# Patient Record
Sex: Female | Born: 1990 | Race: Black or African American | Hispanic: No | Marital: Single | Smoking: Current every day smoker
Health system: Southern US, Community
[De-identification: ages and names within clinical notes are randomized; demographics above are authoritative.]

## PROBLEM LIST (undated history)

## (undated) ENCOUNTER — Inpatient Hospital Stay (HOSPITAL_COMMUNITY): Payer: Self-pay

## (undated) DIAGNOSIS — Z789 Other specified health status: Secondary | ICD-10-CM

## (undated) DIAGNOSIS — Z8619 Personal history of other infectious and parasitic diseases: Secondary | ICD-10-CM

## (undated) HISTORY — DX: Personal history of other infectious and parasitic diseases: Z86.19

## (undated) HISTORY — PX: NO PAST SURGERIES: SHX2092

---
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
## 2009-04-08 ENCOUNTER — Emergency Department (HOSPITAL_COMMUNITY): Admission: EM | Admit: 2009-04-08 | Discharge: 2009-04-08 | Payer: Self-pay | Admitting: Family Medicine

## 2009-06-26 ENCOUNTER — Emergency Department (HOSPITAL_COMMUNITY): Admission: EM | Admit: 2009-06-26 | Discharge: 2009-06-26 | Payer: Self-pay | Admitting: Emergency Medicine

## 2009-07-04 ENCOUNTER — Emergency Department (HOSPITAL_COMMUNITY): Admission: EM | Admit: 2009-07-04 | Discharge: 2009-07-04 | Payer: Self-pay | Admitting: Family Medicine

## 2010-02-26 ENCOUNTER — Emergency Department (HOSPITAL_COMMUNITY): Admission: EM | Admit: 2010-02-26 | Discharge: 2010-02-26 | Payer: Self-pay | Admitting: Family Medicine

## 2010-05-14 ENCOUNTER — Emergency Department (HOSPITAL_COMMUNITY): Admission: EM | Admit: 2010-05-14 | Discharge: 2010-05-14 | Payer: Self-pay | Admitting: Emergency Medicine

## 2010-09-05 ENCOUNTER — Emergency Department (HOSPITAL_COMMUNITY): Admission: EM | Admit: 2010-09-05 | Discharge: 2010-09-05 | Payer: Self-pay | Admitting: Emergency Medicine

## 2011-01-27 LAB — POCT URINALYSIS DIPSTICK
Bilirubin Urine: NEGATIVE
Glucose, UA: NEGATIVE mg/dL
Ketones, ur: NEGATIVE mg/dL
Nitrite: NEGATIVE
Protein, ur: NEGATIVE mg/dL
Specific Gravity, Urine: >=1.03 (ref 1.005–1.030)
Urobilinogen, UA: 0.2 mg/dL (ref 0.0–1.0)
pH: 5 (ref 5.0–8.0)

## 2011-01-27 LAB — GC/CHLAMYDIA PROBE AMP, GENITAL
Chlamydia, DNA Probe: NEGATIVE
GC Probe Amp, Genital: NEGATIVE

## 2011-01-27 LAB — WET PREP, GENITAL: Yeast Wet Prep HPF POC: NONE SEEN

## 2011-01-31 LAB — POCT URINALYSIS DIP (DEVICE)
Hgb urine dipstick: NEGATIVE
Protein, ur: NEGATIVE mg/dL
Specific Gravity, Urine: 1.025 (ref 1.005–1.030)
Urobilinogen, UA: 2 mg/dL — ABNORMAL HIGH (ref 0.0–1.0)
pH: 6 (ref 5.0–8.0)

## 2011-01-31 LAB — GC/CHLAMYDIA PROBE AMP, GENITAL
Chlamydia, DNA Probe: NEGATIVE
GC Probe Amp, Genital: NEGATIVE

## 2011-01-31 LAB — POCT PREGNANCY, URINE: Preg Test, Ur: NEGATIVE

## 2011-01-31 LAB — WET PREP, GENITAL: WBC, Wet Prep HPF POC: NONE SEEN

## 2011-02-15 ENCOUNTER — Inpatient Hospital Stay (INDEPENDENT_AMBULATORY_CARE_PROVIDER_SITE_OTHER)
Admission: RE | Admit: 2011-02-15 | Discharge: 2011-02-15 | Disposition: A | Discharge status: Home / Self Care | Payer: Self-pay | Source: Ambulatory Visit | Attending: Family Medicine | Admitting: Family Medicine

## 2011-02-15 DIAGNOSIS — H6120 Impacted cerumen, unspecified ear: Secondary | ICD-10-CM

## 2011-02-20 LAB — POCT URINALYSIS DIP (DEVICE)
Hgb urine dipstick: NEGATIVE
Nitrite: NEGATIVE
Specific Gravity, Urine: 1.02 (ref 1.005–1.030)
Urobilinogen, UA: 2 mg/dL — ABNORMAL HIGH (ref 0.0–1.0)
pH: 6 (ref 5.0–8.0)

## 2011-02-20 LAB — WET PREP, GENITAL
Clue Cells Wet Prep HPF POC: NONE SEEN
Trich, Wet Prep: NONE SEEN
Yeast Wet Prep HPF POC: NONE SEEN

## 2011-02-20 LAB — POCT PREGNANCY, URINE: Preg Test, Ur: NEGATIVE

## 2011-04-02 NOTE — Op Note (Signed)
NAME:  Diana Clark, Diana Clark                            ACCOUNT NO.:  [id number]   MEDICAL RECORD NO.:  [id number]                   PATIENT TYPE:  INP   LOCATION:  6125                                 FACILITY:  [facility]   PHYSICIAN:  Leonia Corona, M.D.               DATE OF BIRTH:  04-Apr-1991   DATE OF PROCEDURE:  02/23/2004  DATE OF DISCHARGE:                                 OPERATIVE REPORT   PREOPERATIVE DIAGNOSIS:  Right gluteal abscess.   POSTOPERATIVE DIAGNOSIS:  Right gluteal abscess.   OPERATION PERFORMED:  Incision and drainage.   SURGEON:  Leonia Corona, M.D.   ASSISTANT:  Nurse.   ANESTHESIA:  General endotracheal.   INDICATIONS FOR PROCEDURE:  This 20 year old female patient presented to the  emergency room with painful tender swelling over the right buttocks with  discharge of purulent fluid from a central spot on the swelling clinically  consistent with a diagnosis of a large __________ abscess on the right  buttock.  Hence the indication for the procedure.   DESCRIPTION OF PROCEDURE:  The patient was brought to the operating room and  placed supine on the operating table.  General endotracheal tube anesthesia  was given.  The right buttock was cleaned, prepped and draped in the usual  sterile manner.  A small opening at the center of the swelling was pierced  with a blunt tip hemostat and a gush of pus came out which was obtained for  aerobic and anaerobic cultures.  The opening into the abscess cavity was  enlarged with a knife in cruciate fashion.  The edges of the skin corners  were cut with scissors.  The abscess cavity was probed with a right index  finger.  It was a large deep gluteal abscess cavity going up to a depth for  about 4 cm and extending on either side for about 4 to 5 cm.  A fair amount  of thick purulent pus was suctioned out completely.  The abscess cavity was  carefully probed with finger breaking all the septa and cleaning out the  abscess wall.   The wound was then irrigated with dilute hydrogen peroxide  thoroughly until the returning fluid was apparently clear.  No active  bleeders were noted.  The cavity was then packed with iodoform gauze.  It  was covered with Neosporin and 4 x 4 gauze.  The patient tolerated the  procedure well which was smooth and uneventful.  The patient was later  extubated and transported to recovery room in good stable condition.                                               Leonia Corona, M.D.    SF/MEDQ  D:  02/23/2004  T:  02/24/2004  Job:  [id number]

## 2011-04-25 ENCOUNTER — Inpatient Hospital Stay (INDEPENDENT_AMBULATORY_CARE_PROVIDER_SITE_OTHER)
Admission: RE | Admit: 2011-04-25 | Discharge: 2011-04-25 | Disposition: A | Discharge status: Home / Self Care | Payer: Self-pay | Source: Ambulatory Visit | Attending: Emergency Medicine | Admitting: Emergency Medicine

## 2011-04-25 DIAGNOSIS — N949 Unspecified condition associated with female genital organs and menstrual cycle: Secondary | ICD-10-CM

## 2011-04-25 LAB — POCT URINALYSIS DIP (DEVICE)
Leukocytes, UA: NEGATIVE
Nitrite: NEGATIVE
Protein, ur: NEGATIVE mg/dL
pH: 5.5 (ref 5.0–8.0)

## 2011-04-25 LAB — WET PREP, GENITAL: Trich, Wet Prep: NONE SEEN

## 2011-04-25 LAB — POCT PREGNANCY, URINE: Preg Test, Ur: NEGATIVE

## 2011-04-27 LAB — GC/CHLAMYDIA PROBE AMP, GENITAL: Chlamydia, DNA Probe: NEGATIVE

## 2011-08-09 LAB — POCT RAPID STREP A: Streptococcus, Group A Screen (Direct): NEGATIVE

## 2012-07-01 ENCOUNTER — Encounter (HOSPITAL_COMMUNITY): Payer: Self-pay

## 2012-07-01 ENCOUNTER — Emergency Department (INDEPENDENT_AMBULATORY_CARE_PROVIDER_SITE_OTHER)
Admission: EM | Admit: 2012-07-01 | Discharge: 2012-07-01 | Disposition: A | Discharge status: Home / Self Care | Payer: Self-pay | Source: Home / Self Care | Attending: Emergency Medicine | Admitting: Emergency Medicine

## 2012-07-01 DIAGNOSIS — R350 Frequency of micturition: Secondary | ICD-10-CM

## 2012-07-01 DIAGNOSIS — R1903 Right lower quadrant abdominal swelling, mass and lump: Secondary | ICD-10-CM

## 2012-07-01 LAB — POCT URINALYSIS DIP (DEVICE)
Ketones, ur: NEGATIVE mg/dL
Leukocytes, UA: NEGATIVE
Protein, ur: 30 mg/dL — AB
Specific Gravity, Urine: 1.025 (ref 1.005–1.030)

## 2012-07-01 LAB — POCT PREGNANCY, URINE: Preg Test, Ur: NEGATIVE

## 2012-07-01 NOTE — ED Notes (Signed)
Pt has rt groin swelling that started two days ago.  She has some urninary frequency also.

## 2013-11-15 NOTE — L&D Delivery Note (Signed)
Delivery Note At 6:40 AM a viable and healthy female was delivered via Vaginal, Spontaneous Delivery (Presentation: ; Occiput Anterior).  APGAR: 9,9 ; weight P.   Placenta status: Intact, Spontaneous.  Cord: 3 vessels with the following complications: None.   Anesthesia: Epidural  Episiotomy: none Lacerations: periurethral Suture Repair: 3.0 vicryl rapide Est. Blood Loss (mL): 400cc  Mom to postpartum.  Baby to Couplet care / Skin to Skin.  Bovard-Stuckert, Taylie Helder 08/28/2014, 6:50 AM  Br/Bo/RI/B+/Contra?

## 2014-01-05 ENCOUNTER — Encounter (HOSPITAL_COMMUNITY): Payer: Self-pay | Admitting: *Deleted

## 2014-01-05 ENCOUNTER — Inpatient Hospital Stay (HOSPITAL_COMMUNITY)
Admission: AD | Admit: 2014-01-05 | Discharge: 2014-01-05 | Disposition: A | Discharge status: Home / Self Care | Payer: Medicaid Other | Source: Ambulatory Visit | Attending: Obstetrics & Gynecology | Admitting: Obstetrics & Gynecology

## 2014-01-05 DIAGNOSIS — O219 Vomiting of pregnancy, unspecified: Secondary | ICD-10-CM

## 2014-01-05 DIAGNOSIS — O99330 Smoking (tobacco) complicating pregnancy, unspecified trimester: Secondary | ICD-10-CM | POA: Insufficient documentation

## 2014-01-05 DIAGNOSIS — O210 Mild hyperemesis gravidarum: Secondary | ICD-10-CM | POA: Insufficient documentation

## 2014-01-05 LAB — URINALYSIS, ROUTINE W REFLEX MICROSCOPIC
GLUCOSE, UA: NEGATIVE mg/dL
HGB URINE DIPSTICK: NEGATIVE
KETONES UR: NEGATIVE mg/dL
Leukocytes, UA: NEGATIVE
Nitrite: NEGATIVE
PROTEIN: 100 mg/dL — AB
Specific Gravity, Urine: 1.015 (ref 1.005–1.030)
Urobilinogen, UA: 4 mg/dL — ABNORMAL HIGH (ref 0.0–1.0)
pH: 8.5 — ABNORMAL HIGH (ref 5.0–8.0)

## 2014-01-05 LAB — POCT PREGNANCY, URINE: Preg Test, Ur: POSITIVE — AB

## 2014-01-05 LAB — URINE MICROSCOPIC-ADD ON

## 2014-01-05 MED ORDER — ONDANSETRON HCL 4 MG PO TABS: 4.0000 mg | ORAL_TABLET | Freq: Every day | ORAL | Status: DC | PRN

## 2014-01-05 MED ORDER — PROMETHAZINE HCL 25 MG PO TABS: 12.5000 mg | ORAL_TABLET | Freq: Four times a day (QID) | ORAL | Status: DC | PRN

## 2014-01-05 MED ORDER — ONDANSETRON 8 MG PO TBDP
8.0000 mg | ORAL_TABLET | ORAL | Status: AC
  Administered 2014-01-05: 8 mg via ORAL
  Filled 2014-01-05: qty 1

## 2014-01-05 NOTE — MAU Note (Signed)
Vomiting all day, approx. 4 times. Hasn't been able to eat anything. LMP 11/20/2013

## 2014-01-05 NOTE — MAU Provider Note (Signed)
Chief Complaint: Emesis   First Provider Initiated Contact with Patient 01/05/14 0104     SUBJECTIVE HPI: Diana Clark is a 23 y.o. G1P0 at [redacted]w[redacted]d by LMP who presents to maternity admissions reporting nausea x2 days with vomiting starting today and occuring x4.  Patient's last menstrual period was 11/20/2013.  She has irregular menses and reports she had not thought she was pregnant, only that her period was late.  She denies abdominal pain, vaginal bleeding, vaginal itching/burning, urinary symptoms, h/a, dizziness, or fever/chills.     History reviewed. No pertinent past medical history. Past Surgical History  Procedure Laterality Date  . Incise and drain abcess  2005    located on the right buttocks   History   Social History  . Marital Status: Single    Spouse Name: N/A    Number of Children: N/A  . Years of Education: N/A   Occupational History  . Not on file.   Social History Main Topics  . Smoking status: Current Every Day Smoker  . Smokeless tobacco: Not on file  . Alcohol Use: Yes  . Drug Use: No  . Sexual Activity: Yes    Birth Control/ Protection: None   Other Topics Concern  . Not on file   Social History Narrative  . No narrative on file   No current facility-administered medications on file prior to encounter.   No current outpatient prescriptions on file prior to encounter.   No Known Allergies  ROS: Pertinent items in HPI  OBJECTIVE Blood pressure 124/76, pulse 103, temperature 97.9 F (36.6 C), temperature source Oral, resp. rate 16, height 5\' 4"  (1.626 m), weight 75.751 kg (167 lb), last menstrual period 11/20/2013, SpO2 100.00%. GENERAL: Well-developed, well-nourished female in no acute distress.  HEENT: Normocephalic HEART: normal rate RESP: normal effort ABDOMEN: Soft, non-tender EXTREMITIES: Nontender, no edema NEURO: Alert and oriented SPECULUM EXAM: Deferred  LAB RESULTS Results for orders placed during the hospital encounter of  01/05/14 (from the past 24 hour(s))  URINALYSIS, ROUTINE W REFLEX MICROSCOPIC     Status: Abnormal   Collection Time    01/05/14 12:40 AM      Result Value Ref Range   Color, Urine YELLOW  YELLOW   APPearance CLEAR  CLEAR   Specific Gravity, Urine 1.015  1.005 - 1.030   pH 8.5 (*) 5.0 - 8.0   Glucose, UA NEGATIVE  NEGATIVE mg/dL   Hgb urine dipstick NEGATIVE  NEGATIVE   Bilirubin Urine SMALL (*) NEGATIVE   Ketones, ur NEGATIVE  NEGATIVE mg/dL   Protein, ur 100 (*) NEGATIVE mg/dL   Urobilinogen, UA 4.0 (*) 0.0 - 1.0 mg/dL   Nitrite NEGATIVE  NEGATIVE   Leukocytes, UA NEGATIVE  NEGATIVE  URINE MICROSCOPIC-ADD ON     Status: Abnormal   Collection Time    01/05/14 12:40 AM      Result Value Ref Range   Squamous Epithelial / LPF FEW (*) RARE   WBC, UA 0-2  <3 WBC/hpf   RBC / HPF 0-2  <3 RBC/hpf   Bacteria, UA FEW (*) RARE  POCT PREGNANCY, URINE     Status: Abnormal   Collection Time    01/05/14 12:47 AM      Result Value Ref Range   Preg Test, Ur POSITIVE (*) NEGATIVE    ASSESSMENT 1. Nausea/vomiting in pregnancy     PLAN Zofran 8 mg ODT x1 dose in MAU.  Pt able to tolerate PO fluids. Discharge home Warning signs of  first trimester pregnancy given Pregnancy verification letter given Pt to start prenatal care as soon as possible Return to MAU as needed    Medication List         acetaminophen 325 MG tablet  Commonly known as:  TYLENOL  Take 650 mg by mouth every 6 (six) hours as needed.     ondansetron 4 MG tablet  Commonly known as:  ZOFRAN  Take 1 tablet (4 mg total) by mouth daily as needed for nausea or vomiting.     promethazine 25 MG tablet  Commonly known as:  PHENERGAN  Take 0.5-1 tablets (12.5-25 mg total) by mouth every 6 (six) hours as needed for nausea or vomiting.       Follow-up Information   Follow up with [facility] [place]. (Or prenatal provider of your choice.  Return to MAU as needed.)    Contact information:   [address] [place] [address] [phone]      Adeliz Tonkinson Leftwich-Kirby Certified Nurse-Midwife 01/05/2014  1:45 AM

## 2014-01-05 NOTE — Discharge Instructions (Signed)
Nausea/Vomiting of Pregnancy Morning sickness is when you feel sick to your stomach (nauseous) during pregnancy. This nauseous feeling may or may not come with vomiting. It often occurs in the morning but can be a problem any time of day. Morning sickness is most common during the first trimester, but it may continue throughout pregnancy. While morning sickness is unpleasant, it is usually harmless unless you develop severe and continual vomiting (hyperemesis gravidarum). This condition requires more intense treatment.  CAUSES  The cause of morning sickness is not completely known but seems to be related to normal hormonal changes that occur in pregnancy. RISK FACTORS You are at greater risk if you:  Experienced nausea or vomiting before your pregnancy.  Had morning sickness during a previous pregnancy.  Are pregnant with more than one baby, such as twins. TREATMENT  Do not use any medicines (prescription, over-the-counter, or herbal) for morning sickness without first talking to your health care provider. Your health care provider may prescribe or recommend:  Vitamin B6 supplements.  Anti-nausea medicines.  The herbal medicine ginger. HOME CARE INSTRUCTIONS   Only take over-the-counter or prescription medicines as directed by your health care provider.  Taking multivitamins before getting pregnant can prevent or decrease the severity of morning sickness in most women.   Eat a piece of dry toast or unsalted crackers before getting out of bed in the morning.   Eat five or six small meals a day.   Eat dry and bland foods (rice, baked potato). Foods high in carbohydrates are often helpful.  Do not drink liquids with your meals. Drink liquids between meals.   Avoid greasy, fatty, and spicy foods.   Get someone to cook for you if the smell of any food causes nausea and vomiting.   If you feel nauseous after taking prenatal vitamins, take the vitamins at night or with a  snack.  Snack on protein foods (nuts, yogurt, cheese) between meals if you are hungry.   Eat unsweetened gelatins for desserts.   Wearing an acupressure wristband (worn for sea sickness) may be helpful.   Acupuncture may be helpful.   Do not smoke.   Get a humidifier to keep the air in your house free of odors.   Get plenty of fresh air. SEEK MEDICAL CARE IF:   Your home remedies are not working, and you need medicine.  You feel dizzy or lightheaded.  You are losing weight. SEEK IMMEDIATE MEDICAL CARE IF:   You have persistent and uncontrolled nausea and vomiting.  You pass out (faint). Document Released: 12/23/2006 Document Revised: 07/04/2013 Document Reviewed: 04/18/2013 [facility] Patient Information 2014 [place].

## 2014-01-24 ENCOUNTER — Other Ambulatory Visit (HOSPITAL_COMMUNITY): Payer: Self-pay | Admitting: Nurse Practitioner

## 2014-01-24 DIAGNOSIS — Z3682 Encounter for antenatal screening for nuchal translucency: Secondary | ICD-10-CM

## 2014-01-24 LAB — OB RESULTS CONSOLE RUBELLA ANTIBODY, IGM: Rubella: IMMUNE

## 2014-02-08 ENCOUNTER — Encounter (HOSPITAL_COMMUNITY): Payer: Self-pay | Admitting: *Deleted

## 2014-02-08 ENCOUNTER — Inpatient Hospital Stay (HOSPITAL_COMMUNITY)
Admission: AD | Admit: 2014-02-08 | Discharge: 2014-02-09 | Disposition: A | Discharge status: Home / Self Care | Payer: Medicaid Other | Source: Ambulatory Visit | Attending: Obstetrics & Gynecology | Admitting: Obstetrics & Gynecology

## 2014-02-08 DIAGNOSIS — M549 Dorsalgia, unspecified: Secondary | ICD-10-CM | POA: Insufficient documentation

## 2014-02-08 DIAGNOSIS — O9989 Other specified diseases and conditions complicating pregnancy, childbirth and the puerperium: Principal | ICD-10-CM

## 2014-02-08 DIAGNOSIS — K5900 Constipation, unspecified: Secondary | ICD-10-CM | POA: Insufficient documentation

## 2014-02-08 DIAGNOSIS — Z87891 Personal history of nicotine dependence: Secondary | ICD-10-CM | POA: Insufficient documentation

## 2014-02-08 DIAGNOSIS — O99891 Other specified diseases and conditions complicating pregnancy: Secondary | ICD-10-CM | POA: Insufficient documentation

## 2014-02-08 DIAGNOSIS — R109 Unspecified abdominal pain: Secondary | ICD-10-CM | POA: Insufficient documentation

## 2014-02-08 LAB — URINALYSIS, ROUTINE W REFLEX MICROSCOPIC
Bilirubin Urine: NEGATIVE
Glucose, UA: NEGATIVE mg/dL
Hgb urine dipstick: NEGATIVE
Ketones, ur: NEGATIVE mg/dL
LEUKOCYTES UA: NEGATIVE
Nitrite: NEGATIVE
PH: 7 (ref 5.0–8.0)
Protein, ur: NEGATIVE mg/dL
Specific Gravity, Urine: 1.01 (ref 1.005–1.030)
Urobilinogen, UA: 0.2 mg/dL (ref 0.0–1.0)

## 2014-02-08 NOTE — MAU Note (Signed)
The L side of my back is hurting and having abd cramping. Pain since 12n. Denies bleeding or dysuria

## 2014-02-09 MED ORDER — ACETAMINOPHEN 500 MG PO TABS
1000.0000 mg | ORAL_TABLET | Freq: Once | ORAL | Status: AC
  Administered 2014-02-09: 1000 mg via ORAL
  Filled 2014-02-09: qty 2

## 2014-02-09 NOTE — MAU Provider Note (Signed)
History     CSN: [id number]  Arrival date and time: 02/08/14 2235   None     Chief Complaint  Patient presents with  . Abdominal Pain  . Back Pain   HPI  Diana Clark is a 23 y.o. female G1P0 at [redacted]w[redacted]d who presents with complaints of back pain that started today at noon. The patient has not taken anything for pain. She [facility], however does not like it there. She called and scheduled an appt with [facility] and will see them next week. Nothing makes the pain better or worse; the pain is just constant and worse on the left side. Pt was tested for STI's in March and does not feel the need to be rested.  Pt has had problems with constipation; she goes once a week at times goes longer periods of time. When she has a BM it is typically hard.   OB History   Grav Para Term Preterm Abortions TAB SAB Ect Mult Living   1               History reviewed. No pertinent past medical history.  Past Surgical History  Procedure Laterality Date  . Incise and drain abcess  2005    located on the right buttocks    Family History  Problem Relation Age of Onset  . Hypertension Mother   . Hypertension Paternal Grandmother     History  Substance Use Topics  . Smoking status: Former Research scientist (life sciences)  . Smokeless tobacco: Former Systems developer    Quit date: 01/05/2014  . Alcohol Use: Yes    Allergies: No Known Allergies  Prescriptions prior to admission  Medication Sig Dispense Refill  . acetaminophen (TYLENOL) 325 MG tablet Take 650 mg by mouth every 6 (six) hours as needed.      . Prenatal Vit-Fe Fumarate-FA (PRENATAL MULTIVITAMIN) TABS tablet Take 1 tablet by mouth daily at 12 noon.      . promethazine (PHENERGAN) 25 MG tablet Take 0.5-1 tablets (12.5-25 mg total) by mouth every 6 (six) hours as needed for nausea or vomiting.  30 tablet  2  . ondansetron (ZOFRAN) 4 MG tablet Take 1 tablet (4 mg total) by mouth daily as needed for nausea or vomiting.   30 tablet  1   Results for orders placed during the hospital encounter of 02/08/14 (from the past 48 hour(s))  URINALYSIS, ROUTINE W REFLEX MICROSCOPIC     Status: None   Collection Time    02/08/14 10:49 PM      Result Value Ref Range   Color, Urine YELLOW  YELLOW   APPearance CLEAR  CLEAR   Specific Gravity, Urine 1.010  1.005 - 1.030   pH 7.0  5.0 - 8.0   Glucose, UA NEGATIVE  NEGATIVE mg/dL   Hgb urine dipstick NEGATIVE  NEGATIVE   Bilirubin Urine NEGATIVE  NEGATIVE   Ketones, ur NEGATIVE  NEGATIVE mg/dL   Protein, ur NEGATIVE  NEGATIVE mg/dL   Urobilinogen, UA 0.2  0.0 - 1.0 mg/dL   Nitrite NEGATIVE  NEGATIVE   Leukocytes, UA NEGATIVE  NEGATIVE   Comment: MICROSCOPIC NOT DONE ON URINES WITH NEGATIVE PROTEIN, BLOOD, LEUKOCYTES, NITRITE, OR GLUCOSE <1000 mg/dL.    Review of Systems  Constitutional: Negative for fever and chills.  Gastrointestinal: Positive for abdominal pain (+ Left sided abdominal pain ) and constipation (last BM was 3 days ago. ). Negative for heartburn, nausea, vomiting and diarrhea.  Genitourinary: Positive for dysuria. Negative for urgency, frequency and hematuria.       No vaginal discharge. No vaginal bleeding. + dysuria " a little bit".    Musculoskeletal: Positive for back pain (L>R).   Physical Exam   Blood pressure 117/75, pulse 90, temperature 98.6 F (37 C), resp. rate 18, height 5\' 4"  (1.626 m), weight 78.019 kg (172 lb), last menstrual period 11/20/2013.  Physical Exam  Constitutional: She is oriented to person, place, and time. Vital signs are normal. She appears well-developed and well-nourished.  Non-toxic appearance. She does not have a sickly appearance. She does not appear ill. No distress.  HENT:  Head: Normocephalic.  Eyes: Pupils are equal, round, and reactive to light.  Neck: Neck supple.  Respiratory: Effort normal.  GI: Soft. Normal appearance. There is no tenderness. There is no CVA tenderness.  Musculoskeletal: Normal  range of motion.  Neurological: She is alert and oriented to person, place, and time.  Skin: Skin is warm.  Psychiatric: Her behavior is normal.    MAU Course  Procedures None  MDM +fht  Tylenol 1 gram Assessment and Plan   A:  Back pain in pregnancy Constipation   P:  Discharge home in stable condition Miralax as directed on the bottle  Stool softer as directed on the bottle  Pregnancy support belt recommended   Darrelyn Hillock Kavya Haag, NP  02/09/2014, 5:25 AM

## 2014-02-09 NOTE — Progress Notes (Signed)
Jennifer Rasch NP in earlier to discuss d/c plan. Written and verbal d/c instructions given and understanding voiced 

## 2014-02-14 LAB — OB RESULTS CONSOLE ABO/RH: RH TYPE: POSITIVE

## 2014-02-14 LAB — OB RESULTS CONSOLE ANTIBODY SCREEN: Antibody Screen: NEGATIVE

## 2014-02-14 LAB — OB RESULTS CONSOLE HEPATITIS B SURFACE ANTIGEN: HEP B S AG: NEGATIVE

## 2014-02-15 ENCOUNTER — Ambulatory Visit (HOSPITAL_COMMUNITY): Admission: RE | Admit: 2014-02-15 | Payer: Medicaid Other | Source: Ambulatory Visit

## 2014-02-15 ENCOUNTER — Other Ambulatory Visit (HOSPITAL_COMMUNITY): Payer: Self-pay | Admitting: Nurse Practitioner

## 2014-02-15 ENCOUNTER — Ambulatory Visit (HOSPITAL_COMMUNITY)
Admission: RE | Admit: 2014-02-15 | Discharge: 2014-02-15 | Disposition: A | Discharge status: Home / Self Care | Payer: Medicaid Other | Source: Ambulatory Visit | Attending: Nurse Practitioner | Admitting: Nurse Practitioner

## 2014-02-15 DIAGNOSIS — Z3682 Encounter for antenatal screening for nuchal translucency: Secondary | ICD-10-CM

## 2014-02-15 DIAGNOSIS — Z3689 Encounter for other specified antenatal screening: Secondary | ICD-10-CM | POA: Insufficient documentation

## 2014-02-15 DIAGNOSIS — O3510X0 Maternal care for (suspected) chromosomal abnormality in fetus, unspecified, not applicable or unspecified: Secondary | ICD-10-CM | POA: Insufficient documentation

## 2014-02-15 DIAGNOSIS — O351XX0 Maternal care for (suspected) chromosomal abnormality in fetus, not applicable or unspecified: Secondary | ICD-10-CM | POA: Insufficient documentation

## 2014-02-19 ENCOUNTER — Other Ambulatory Visit (HOSPITAL_COMMUNITY): Payer: Self-pay | Admitting: Nurse Practitioner

## 2014-02-19 DIAGNOSIS — Z3682 Encounter for antenatal screening for nuchal translucency: Secondary | ICD-10-CM

## 2014-02-22 ENCOUNTER — Ambulatory Visit (HOSPITAL_COMMUNITY)
Admission: RE | Admit: 2014-02-22 | Discharge: 2014-02-22 | Disposition: A | Discharge status: Home / Self Care | Payer: Medicaid Other | Source: Ambulatory Visit | Attending: Nurse Practitioner | Admitting: Nurse Practitioner

## 2014-02-22 ENCOUNTER — Ambulatory Visit (HOSPITAL_COMMUNITY): Admission: RE | Admit: 2014-02-22 | Payer: Medicaid Other | Source: Ambulatory Visit

## 2014-02-22 DIAGNOSIS — O3510X0 Maternal care for (suspected) chromosomal abnormality in fetus, unspecified, not applicable or unspecified: Secondary | ICD-10-CM | POA: Insufficient documentation

## 2014-02-22 DIAGNOSIS — Z3682 Encounter for antenatal screening for nuchal translucency: Secondary | ICD-10-CM

## 2014-02-22 DIAGNOSIS — Z3689 Encounter for other specified antenatal screening: Secondary | ICD-10-CM | POA: Insufficient documentation

## 2014-02-22 DIAGNOSIS — O3410 Maternal care for benign tumor of corpus uteri, unspecified trimester: Secondary | ICD-10-CM | POA: Insufficient documentation

## 2014-02-22 DIAGNOSIS — O351XX0 Maternal care for (suspected) chromosomal abnormality in fetus, not applicable or unspecified: Secondary | ICD-10-CM | POA: Insufficient documentation

## 2014-02-28 LAB — OB RESULTS CONSOLE GC/CHLAMYDIA
Chlamydia: NEGATIVE
Gonorrhea: NEGATIVE

## 2014-05-13 ENCOUNTER — Encounter (HOSPITAL_COMMUNITY): Payer: Self-pay | Admitting: *Deleted

## 2014-05-13 ENCOUNTER — Inpatient Hospital Stay (HOSPITAL_COMMUNITY)
Admission: AD | Admit: 2014-05-13 | Discharge: 2014-05-13 | Disposition: A | Discharge status: Home / Self Care | Payer: Medicaid Other | Source: Ambulatory Visit | Attending: Obstetrics and Gynecology | Admitting: Obstetrics and Gynecology

## 2014-05-13 DIAGNOSIS — O99891 Other specified diseases and conditions complicating pregnancy: Secondary | ICD-10-CM | POA: Insufficient documentation

## 2014-05-13 DIAGNOSIS — N949 Unspecified condition associated with female genital organs and menstrual cycle: Secondary | ICD-10-CM | POA: Insufficient documentation

## 2014-05-13 DIAGNOSIS — O9989 Other specified diseases and conditions complicating pregnancy, childbirth and the puerperium: Principal | ICD-10-CM

## 2014-05-13 DIAGNOSIS — R109 Unspecified abdominal pain: Secondary | ICD-10-CM | POA: Insufficient documentation

## 2014-05-13 DIAGNOSIS — Z87891 Personal history of nicotine dependence: Secondary | ICD-10-CM | POA: Insufficient documentation

## 2014-05-13 HISTORY — DX: Other specified health status: Z78.9

## 2014-05-13 LAB — WET PREP, GENITAL
Trich, Wet Prep: NONE SEEN
Yeast Wet Prep HPF POC: NONE SEEN

## 2014-05-13 MED ORDER — ACETAMINOPHEN 500 MG PO TABS
1000.0000 mg | ORAL_TABLET | Freq: Once | ORAL | Status: AC
  Administered 2014-05-13: 1000 mg via ORAL
  Filled 2014-05-13: qty 2

## 2014-05-13 MED ORDER — SIMETHICONE 80 MG PO CHEW
160.0000 mg | CHEWABLE_TABLET | Freq: Once | ORAL | Status: AC
  Administered 2014-05-13: 160 mg via ORAL
  Filled 2014-05-13: qty 2

## 2014-05-13 MED ORDER — ZOLPIDEM TARTRATE 5 MG PO TABS: 5.0000 mg | ORAL_TABLET | Freq: Once | ORAL | Status: DC

## 2014-05-13 MED ORDER — ZOLPIDEM TARTRATE 5 MG PO TABS: 5.0000 mg | ORAL_TABLET | Freq: Every evening | ORAL | Status: DC | PRN

## 2014-05-13 NOTE — MAU Note (Signed)
PT  SAYS SHE STARTED HAVING CRAMPS  TODAY AT 2PM.-     SHE WAS AT WORK-  DID NOT CALL DR. LAST SEEN AT OFFICE ON  6-10 -  FOR U/S  THEN ON 6-11- FOR VISIT.   Marland Kitchen  HAS  AN APPOINTMENT  WITH [facility]  [facility]  FROM [facility] ON 7-1-  BECAUSE FETAL HR IS SKIPPING.      DENIES HSV AND MRSA.   LAST SEX-    April   .Marland Kitchen  DENIES ANY PROBLEMS VOIDING.

## 2014-05-13 NOTE — MAU Provider Note (Signed)
History     CSN: [id number]  Arrival date and time: 05/13/14 1017   First Provider Initiated Contact with Patient 05/13/14 2040      No chief complaint on file.  HPI  Diana Clark is a 23 y.o. G1P0 at [redacted]w[redacted]d who presents today with generalized, "all over abdominal pain". She states that the pain started around 1400 today. She has not tried anything for the pain. She states that the pain does not come and go like contractions. She states that it is a dull constant ache.  Past Medical History  Diagnosis Date  . Medical history non-contributory     Past Surgical History  Procedure Laterality Date  . Incise and drain abcess  2005    located on the right buttocks  . No past surgeries      Family History  Problem Relation Age of Onset  . Hypertension Mother   . Hypertension Paternal Grandmother     History  Substance Use Topics  . Smoking status: Former Research scientist (life sciences)  . Smokeless tobacco: Former Systems developer    Quit date: 01/05/2014  . Alcohol Use: No    Allergies: No Known Allergies  Prescriptions prior to admission  Medication Sig Dispense Refill  . Prenatal Vit-Fe Fumarate-FA (PRENATAL MULTIVITAMIN) TABS tablet Take 1 tablet by mouth daily at 12 noon.        ROS Physical Exam   Blood pressure 100/62, pulse 94, temperature 98.7 F (37.1 C), temperature source Oral, resp. rate 18, height 5\' 3"  (1.6 m), weight 82.668 kg (182 lb 4 oz), last menstrual period 11/20/2013.  Physical Exam  Nursing note and vitals reviewed. Constitutional: She is oriented to person, place, and time. She appears well-developed and well-nourished. No distress.  Cardiovascular: Normal rate.   Respiratory: Effort normal.  GI: Soft. There is no tenderness. There is no rebound.  Genitourinary:   External: no lesion Vagina: small amount of white discharge Cervix: pink, smooth, closed/thick/high  Uterus: AGA     Neurological: She is alert and oriented to person, place, and time.  Skin: Skin is warm and  dry.  Psychiatric: She has a normal mood and affect.   FHT 130, moderate with 10x10 accels, and audible skipped beats. Patient has an appointment with MFM on 05/15/14 Toco: no UCs  MAU Course  Procedures  Results for orders placed during the hospital encounter of 05/13/14 (from the past 24 hour(s))  WET PREP, GENITAL     Status: Abnormal   Collection Time    05/13/14  8:55 PM      Result Value Ref Range   Yeast Wet Prep HPF POC NONE SEEN  NONE SEEN   Trich, Wet Prep NONE SEEN  NONE SEEN   Clue Cells Wet Prep HPF POC FEW (*) NONE SEEN   WBC, Wet Prep HPF POC FEW (*) NONE SEEN   2145: Patient reports some improvement after tylenol and warm compresses. She states that would like to go home and go to bed now.  2152: D/W Dr. Melba Coon patient is ok for dc home may have an ambien prior to dc if desired.   Assessment and Plan   1. Round ligament pain    Comfort measures reviewed Fetal kick counts PTL precautions Return to MAU as needed  Follow-up Information   Follow up with [facility]. (As scheduled)    Contact information:   [address]  [address] [place] [address] [phone]        Marcille Buffy  Donovan 05/13/2014, 8:58 PM

## 2014-05-13 NOTE — Discharge Instructions (Signed)

## 2014-05-13 NOTE — MAU Note (Signed)
Urine in lab 

## 2014-06-06 LAB — OB RESULTS CONSOLE HIV ANTIBODY (ROUTINE TESTING): HIV: NONREACTIVE

## 2014-06-06 LAB — OB RESULTS CONSOLE RPR: RPR: NONREACTIVE

## 2014-08-02 LAB — OB RESULTS CONSOLE GBS: STREP GROUP B AG: NEGATIVE

## 2014-08-12 ENCOUNTER — Encounter (HOSPITAL_COMMUNITY): Payer: Self-pay | Admitting: Emergency Medicine

## 2014-08-12 ENCOUNTER — Emergency Department (HOSPITAL_COMMUNITY)
Admission: EM | Admit: 2014-08-12 | Discharge: 2014-08-12 | Disposition: A | Discharge status: Home / Self Care | Payer: Medicaid Other | Source: Home / Self Care | Attending: Family Medicine | Admitting: Family Medicine

## 2014-08-12 DIAGNOSIS — K006 Disturbances in tooth eruption: Secondary | ICD-10-CM

## 2014-08-12 MED ORDER — CHLORHEXIDINE GLUCONATE 0.12 % MT SOLN
15.0000 mL | Freq: Two times a day (BID) | OROMUCOSAL | Status: DC
Start: 2014-08-12 — End: 2014-08-27

## 2014-08-12 NOTE — ED Notes (Signed)
Pt  Is   [redacted]  Weeks  Pregnant  With   Symptoms  Of  Pain /  Swelling  Of  Her  Gums  specefically  The  r  Lower  Side       -  She    denys  Any   OB  Symptoms                She  Is  Sitting  Upright on the  Exam table  Speaking in  Complete  sentances

## 2014-08-12 NOTE — ED Provider Notes (Signed)
CSN: [id number]     Arrival date & time 08/12/14  1701 History   First MD Initiated Contact with Patient 08/12/14 1719     Chief Complaint  Patient presents with  . Dental Problem   (Consider location/radiation/quality/duration/timing/severity/associated sxs/prior Treatment) HPI Comments: Patient states that she has a tender swollen area of her right lower gumline that she noticed today. Denies dental pain.  Reports herself to be otherwise healthy.  Non-smoker Works in Therapist, art  The history is provided by the patient.    Past Medical History  Diagnosis Date  . Medical history non-contributory    Past Surgical History  Procedure Laterality Date  . Incise and drain abcess  2005    located on the right buttocks  . No past surgeries     Family History  Problem Relation Age of Onset  . Hypertension Mother   . Hypertension Paternal Grandmother    History  Substance Use Topics  . Smoking status: Former Research scientist (life sciences)  . Smokeless tobacco: Former Systems developer    Quit date: 01/05/2014  . Alcohol Use: No   OB History   Grav Para Term Preterm Abortions TAB SAB Ect Mult Living   1              Review of Systems  All other systems reviewed and are negative.   Allergies  Review of patient's allergies indicates no known allergies.  Home Medications   Prior to Admission medications   Medication Sig Start Date End Date Taking? Authorizing Provider  chlorhexidine (PERIDEX) 0.12 % solution Use as directed 15 mLs in the mouth or throat 2 (two) times daily. Swish & spit 08/12/14   Lutricia Feil, PA  Prenatal Vit-Fe Fumarate-FA (PRENATAL MULTIVITAMIN) TABS tablet Take 1 tablet by mouth daily at 12 noon.    Historical Provider, MD  zolpidem (AMBIEN) 5 MG tablet Take 1 tablet (5 mg total) by mouth at bedtime as needed for sleep. 05/13/14   Heather Erby Pian, CNM   BP 119/76  Pulse 90  Temp(Src) 97.8 F (36.6 C) (Oral)  Resp 14  SpO2 100%  LMP 11/20/2013 Physical Exam    Nursing note and vitals reviewed. Constitutional: She is oriented to person, place, and time. She appears well-developed and well-nourished. No distress.  HENT:  Head: Normocephalic and atraumatic.  Nose: Nose normal.  Mouth/Throat: Uvula is midline, oropharynx is clear and moist and mucous membranes are normal. No trismus in the jaw. Normal dentition. No dental abscesses, uvula swelling, lacerations or dental caries.    Neck: Normal range of motion. Neck supple.  Cardiovascular: Normal rate.   Pulmonary/Chest: Effort normal.  Musculoskeletal: Normal range of motion.  Lymphadenopathy:    She has no cervical adenopathy.  Neurological: She is alert and oriented to person, place, and time.  Skin: Skin is warm and dry.  Psychiatric: She has a normal mood and affect. Her behavior is normal.    ED Course  Procedures (including critical care time) Labs Review Labs Reviewed - No data to display  Imaging Review No results found.   MDM   1. Tooth eruption disorder    Patient is currently [redacted] weeks pregnant and was advised that she will likely need to wait until after delivery to see oral surgeon for wisdom tooth extraction if symptoms persist. May use tylenol as directed on packaging for discomfort and rinse with Peridex as prescribed. Contact oral surgeon of her choice for follow up.    Lutricia Feil, PA  08/12/14 1753 

## 2014-08-12 NOTE — Discharge Instructions (Signed)
Wisdom Teeth Wisdom teeth are the last set of molars to grow in at the back of the mouth. A wisdom tooth can grow in each of the four back areas of the mouth, the:  Top right.  Top left.  Bottom right.  Bottom left. The 4 molars usually appear during later adolescence, between the ages of 63 and 64. A small percentage of people are born with one or more missing wisdom teeth or have none at all. Wisdom teeth may become painful if:  There is not enough room in the mouth for them to emerge.  They are misaligned.  Infection develops. With proper alignment and adequate space, healthy wisdom teeth can be an asset. CAUSES Often there is not enough room in the back of the jaw for the wisdom teeth. They can become trapped inside the gum (impacted). They may also grow sideways or only partially erupt. SYMPTOMS The presence of wisdom teeth or impacted wisdom teeth may not cause any symptoms. However, problems with one or more wisdom teeth may result in:  Pain.  Swelling around the tooth.  Stiff jaw.  General feeling of illness.  Inability to fully open your mouth (trismus).  Bad breath or unpleasant taste in your mouth. Impacted teeth may increase the risk of:  Infection.  Damage to adjacent teeth.  Growth of cysts (formed when the sac surrounding the impacted tooth becomes filled with fluid and enlarges). DIAGNOSIS  Oral exam.  X-rays. TREATMENT Your dentist or oral surgeon will recommend the best course of action for you. This may include surgical removal (extraction) of the wisdom teeth. Extraction is generally recommended to avoid complications. Removal and recovery time is easier if done in early adulthood since the roots of the teeth are smaller at this time and surrounding bone is softer. Sometimes your dentist or oral surgeon may recommend extraction before symptoms develop. This is done to avoid a more painful or complicated extraction at a later date. HOME CARE  INSTRUCTIONS  Follow up with your caregiver as directed.  If your wisdom teeth are causing pain or other symptoms:  Only take over-the-counter or prescription medicines for pain, discomfort, or fever as directed by your caregiver.  Ice the affected area for 20 minutes at a time, 3 to 4 times per day.Place a towel on the skin over the painful area and the ice or cold pack over the towel. Do not place ice directly on the skin.  Eat soft foods or a liquid diet.   Mild or moderate fevers generally have no long-term effects and often do not require treatment. PROGNOSIS Most patients recover fully from tooth extraction with proper care and pain management.  SEEK DENTAL DENTAL CARE IF :  Pain emerges, worsens, or is not controlled by the medicine you have been given.  Bleeding occurs. SEEK IMMEDIATE DENTAL CARE IF:  You have a fever or persistent symptoms for more than 72 hours.  You have a fever and your symptoms suddenly get worse. FOR MORE INFORMATION  American Dental Association: https://www.mckee-young.org/  American Association of Oral and Maxillofacial Surgeons: http://www.myoms.org/ MAKE SURE YOU:  Understand these instructions.  Will watch your condition.  Will get help right away if you are not doing well or get worse. Document Released: 12/04/2010 Document Revised: 03/18/2014 Document Reviewed: 12/04/2010 [facility] Patient Information 2015 [place], [place]. This information is not intended to replace advice given to you by your health care provider. Make sure you discuss any questions you have with your health care provider.  Dental Care and Dentist Visits Dental care supports good overall health. Regular dental visits can also help you avoid dental pain, bleeding, infection, and other more serious health problems in the future. It is important to keep the mouth healthy because diseases in the teeth, gums, and other oral tissues can spread to other areas of the body. Some  problems, such as diabetes, heart disease, and pre-term labor have been associated with poor oral health.  See your dentist every 6 months. If you experience emergency problems such as a toothache or broken tooth, go to the dentist right away. If you see your dentist regularly, you may catch problems early. It is easier to be treated for problems in the early stages.  WHAT TO EXPECT AT A DENTIST VISIT  Your dentist will look for many common oral health problems and recommend proper treatment. At your regular dental visit, you can expect:  Gentle cleaning of the teeth and gums. This includes scraping and polishing. This helps to remove the sticky substance around the teeth and gums (plaque). Plaque forms in the mouth shortly after eating. Over time, plaque hardens on the teeth as tartar. If tartar is not removed regularly, it can cause problems. Cleaning also helps remove stains.  Periodic X-rays. These pictures of the teeth and supporting bone will help your dentist assess the health of your teeth.  Periodic fluoride treatments. Fluoride is a natural mineral shown to help strengthen teeth. Fluoride treatmentinvolves applying a fluoride gel or varnish to the teeth. It is most commonly done in children.  Examination of the mouth, tongue, jaws, teeth, and gums to look for any oral health problems, such as:  Cavities (dental caries). This is decay on the tooth caused by plaque, sugar, and acid in the mouth. It is best to catch a cavity when it is small.  Inflammation of the gums caused by plaque buildup (gingivitis).  Problems with the mouth or malformed or misaligned teeth.  Oral cancer or other diseases of the soft tissues or jaws. KEEP YOUR TEETH AND GUMS HEALTHY For healthy teeth and gums, follow these general guidelines as well as your dentist's specific advice:  Have your teeth professionally cleaned at the dentist every 6 months.  Brush twice daily with a fluoride toothpaste.  Floss  your teeth daily.  Ask your dentist if you need fluoride supplements, treatments, or fluoride toothpaste.  Eat a healthy diet. Reduce foods and drinks with added sugar.  Avoid smoking. TREATMENT FOR ORAL HEALTH PROBLEMS If you have oral health problems, treatment varies depending on the conditions present in your teeth and gums.  Your caregiver will most likely recommend good oral hygiene at each visit.  For cavities, gingivitis, or other oral health disease, your caregiver will perform a procedure to treat the problem. This is typically done at a separate appointment. Sometimes your caregiver will refer you to another dental specialist for specific tooth problems or for surgery. SEEK IMMEDIATE DENTAL CARE IF:  You have pain, bleeding, or soreness in the gum, tooth, jaw, or mouth area.  A permanent tooth becomes loose or separated from the gum socket.  You experience a blow or injury to the mouth or jaw area. Document Released: 07/14/2011 Document Revised: 01/24/2012 Document Reviewed: 07/14/2011 [facility] Patient Information 2015 [place], [place]. This information is not intended to replace advice given to you by your health care provider. Make sure you discuss any questions you have with your health care provider.

## 2014-08-12 NOTE — ED Provider Notes (Signed)
Medical screening examination/treatment/procedure(s) were performed by resident physician or non-physician practitioner and as supervising physician I was immediately available for consultation/collaboration.   Pauline Good MD.   Billy Fischer, MD 08/12/14 [phone]

## 2014-08-27 ENCOUNTER — Encounter (HOSPITAL_COMMUNITY): Payer: Self-pay | Admitting: *Deleted

## 2014-08-27 ENCOUNTER — Encounter (HOSPITAL_COMMUNITY): Payer: Medicaid Other | Admitting: Anesthesiology

## 2014-08-27 ENCOUNTER — Inpatient Hospital Stay (HOSPITAL_COMMUNITY): Payer: Medicaid Other | Admitting: Anesthesiology

## 2014-08-27 ENCOUNTER — Inpatient Hospital Stay (HOSPITAL_COMMUNITY)
Admission: AD | Admit: 2014-08-27 | Discharge: 2014-08-29 | DRG: 775 | Disposition: A | Discharge status: Home / Self Care | Payer: Medicaid Other | Source: Ambulatory Visit | Attending: Obstetrics and Gynecology | Admitting: Obstetrics and Gynecology

## 2014-08-27 DIAGNOSIS — K219 Gastro-esophageal reflux disease without esophagitis: Secondary | ICD-10-CM | POA: Diagnosis present

## 2014-08-27 DIAGNOSIS — Z8249 Family history of ischemic heart disease and other diseases of the circulatory system: Secondary | ICD-10-CM

## 2014-08-27 DIAGNOSIS — Z3A40 40 weeks gestation of pregnancy: Secondary | ICD-10-CM | POA: Diagnosis present

## 2014-08-27 DIAGNOSIS — Z87891 Personal history of nicotine dependence: Secondary | ICD-10-CM

## 2014-08-27 DIAGNOSIS — O99214 Obesity complicating childbirth: Secondary | ICD-10-CM | POA: Diagnosis present

## 2014-08-27 DIAGNOSIS — O99613 Diseases of the digestive system complicating pregnancy, third trimester: Secondary | ICD-10-CM | POA: Diagnosis present

## 2014-08-27 DIAGNOSIS — Z6832 Body mass index (BMI) 32.0-32.9, adult: Secondary | ICD-10-CM | POA: Diagnosis not present

## 2014-08-27 DIAGNOSIS — O471 False labor at or after 37 completed weeks of gestation: Secondary | ICD-10-CM | POA: Diagnosis present

## 2014-08-27 DIAGNOSIS — E669 Obesity, unspecified: Secondary | ICD-10-CM | POA: Diagnosis present

## 2014-08-27 DIAGNOSIS — IMO0001 Reserved for inherently not codable concepts without codable children: Secondary | ICD-10-CM

## 2014-08-27 LAB — CBC
HCT: 38.7 % (ref 36.0–46.0)
Hemoglobin: 13.4 g/dL (ref 12.0–15.0)
MCH: 31.1 pg (ref 26.0–34.0)
MCHC: 34.6 g/dL (ref 30.0–36.0)
MCV: 89.8 fL (ref 78.0–100.0)
PLATELETS: 201 10*3/uL (ref 150–400)
RBC: 4.31 MIL/uL (ref 3.87–5.11)
RDW: 13.9 % (ref 11.5–15.5)
WBC: 11 10*3/uL — AB (ref 4.0–10.5)

## 2014-08-27 LAB — TYPE AND SCREEN
ABO/RH(D): B POS
Antibody Screen: NEGATIVE

## 2014-08-27 LAB — MRSA PCR SCREENING: MRSA BY PCR: NEGATIVE

## 2014-08-27 MED ORDER — LACTATED RINGERS IV SOLN: 500.0000 mL | INTRAVENOUS | Status: DC | PRN

## 2014-08-27 MED ORDER — ONDANSETRON HCL 4 MG/2ML IJ SOLN: 4.0000 mg | Freq: Four times a day (QID) | INTRAMUSCULAR | Status: DC | PRN

## 2014-08-27 MED ORDER — LIDOCAINE HCL (PF) 1 % IJ SOLN
30.0000 mL | INTRAMUSCULAR | Status: DC | PRN
  Filled 2014-08-27: qty 30

## 2014-08-27 MED ORDER — DIPHENHYDRAMINE HCL 50 MG/ML IJ SOLN
12.5000 mg | INTRAMUSCULAR | Status: DC | PRN
  Administered 2014-08-28: 12.5 mg via INTRAVENOUS
  Filled 2014-08-27: qty 1

## 2014-08-27 MED ORDER — CITRIC ACID-SODIUM CITRATE 334-500 MG/5ML PO SOLN
30.0000 mL | ORAL | Status: DC | PRN
  Administered 2014-08-28: 30 mL via ORAL
  Filled 2014-08-27: qty 15

## 2014-08-27 MED ORDER — OXYTOCIN 40 UNITS IN LACTATED RINGERS INFUSION - SIMPLE MED
1.0000 m[IU]/min | INTRAVENOUS | Status: DC
  Administered 2014-08-27: 2 m[IU]/min via INTRAVENOUS
  Filled 2014-08-27: qty 1000

## 2014-08-27 MED ORDER — OXYCODONE-ACETAMINOPHEN 5-325 MG PO TABS: 1.0000 | ORAL_TABLET | ORAL | Status: DC | PRN

## 2014-08-27 MED ORDER — LACTATED RINGERS IV SOLN: 500.0000 mL | Freq: Once | INTRAVENOUS | Status: DC

## 2014-08-27 MED ORDER — OXYTOCIN 40 UNITS IN LACTATED RINGERS INFUSION - SIMPLE MED
62.5000 mL/h | INTRAVENOUS | Status: DC
  Administered 2014-08-28: 62.5 mL/h via INTRAVENOUS

## 2014-08-27 MED ORDER — EPHEDRINE 5 MG/ML INJ
10.0000 mg | INTRAVENOUS | Status: DC | PRN
  Filled 2014-08-27: qty 2

## 2014-08-27 MED ORDER — FENTANYL 2.5 MCG/ML BUPIVACAINE 1/10 % EPIDURAL INFUSION (WH - ANES)
INTRAMUSCULAR | Status: DC | PRN
  Administered 2014-08-27: 14 mL/h via EPIDURAL

## 2014-08-27 MED ORDER — PHENYLEPHRINE 40 MCG/ML (10ML) SYRINGE FOR IV PUSH (FOR BLOOD PRESSURE SUPPORT)
80.0000 ug | PREFILLED_SYRINGE | INTRAVENOUS | Status: DC | PRN
  Filled 2014-08-27: qty 2
  Filled 2014-08-27: qty 10

## 2014-08-27 MED ORDER — PHENYLEPHRINE 40 MCG/ML (10ML) SYRINGE FOR IV PUSH (FOR BLOOD PRESSURE SUPPORT)
80.0000 ug | PREFILLED_SYRINGE | INTRAVENOUS | Status: DC | PRN
  Filled 2014-08-27: qty 2

## 2014-08-27 MED ORDER — TERBUTALINE SULFATE 1 MG/ML IJ SOLN: 0.2500 mg | Freq: Once | INTRAMUSCULAR | Status: AC | PRN

## 2014-08-27 MED ORDER — ACETAMINOPHEN 325 MG PO TABS
650.0000 mg | ORAL_TABLET | ORAL | Status: DC | PRN
Start: 2014-08-27 — End: 2014-08-28

## 2014-08-27 MED ORDER — FENTANYL 2.5 MCG/ML BUPIVACAINE 1/10 % EPIDURAL INFUSION (WH - ANES)
14.0000 mL/h | INTRAMUSCULAR | Status: DC | PRN
  Administered 2014-08-27 – 2014-08-28 (×2): 14 mL/h via EPIDURAL
  Filled 2014-08-27 (×2): qty 125

## 2014-08-27 MED ORDER — LACTATED RINGERS IV SOLN
INTRAVENOUS | Status: DC
  Administered 2014-08-27 – 2014-08-28 (×2): via INTRAVENOUS

## 2014-08-27 MED ORDER — OXYTOCIN BOLUS FROM INFUSION
500.0000 mL | INTRAVENOUS | Status: DC
  Administered 2014-08-28: 500 mL via INTRAVENOUS

## 2014-08-27 MED ORDER — OXYCODONE-ACETAMINOPHEN 5-325 MG PO TABS: 2.0000 | ORAL_TABLET | ORAL | Status: DC | PRN

## 2014-08-27 MED ORDER — FLEET ENEMA 7-19 GM/118ML RE ENEM: 1.0000 | ENEMA | RECTAL | Status: DC | PRN

## 2014-08-27 MED ORDER — LIDOCAINE HCL (PF) 1 % IJ SOLN
INTRAMUSCULAR | Status: DC | PRN
  Administered 2014-08-27 (×2): 4 mL

## 2014-08-27 MED ORDER — BUTORPHANOL TARTRATE 1 MG/ML IJ SOLN: 1.0000 mg | INTRAMUSCULAR | Status: DC | PRN

## 2014-08-27 NOTE — H&P (Signed)
Diana Clark is a 23 y.o. female g61@ 40wk with contractions, slow cervical change.  Uncomplicated prenatal care.  Normal fetal echo 05/15/14.     Maternal Medical History:  Reason for admission: Contractions.   Contractions: Onset was 2 days ago.   Frequency: regular.    Fetal activity: Perceived fetal activity is normal.    Prenatal Complications - Diabetes: none.    OB History   Grav Para Term Preterm Abortions TAB SAB Ect Mult Living   1             G1 present No abn pap + Chl, trich  Past Medical History  Diagnosis Date  . Medical history non-contributory    Past Surgical History  Procedure Laterality Date  . Incise and drain abcess  2005    located on the right buttocks  . No past surgeries     Family History: family history includes Hypertension in her mother and paternal grandmother. Social History:  reports that she has quit smoking. She quit smokeless tobacco use about 7 months ago. She reports that she does not drink alcohol or use illicit drugs.  Meds PNV All NKDA   Prenatal Transfer Tool  Maternal Diabetes: No Genetic Screening: Normal Maternal Ultrasounds/Referrals: Normal Fetal Ultrasounds or other Referrals:  None Maternal Substance Abuse:  Yes:  Type: Smoker Significant Maternal Medications:  None Significant Maternal Lab Results:  Lab values include: Group B Strep negative Other Comments:  None  Review of Systems  Constitutional: Negative.   HENT: Negative.   Eyes: Negative.   Respiratory: Negative.   Cardiovascular: Negative.   Gastrointestinal: Negative.   Genitourinary: Negative.   Musculoskeletal: Negative.   Skin: Negative.   Neurological: Negative.   Psychiatric/Behavioral: Negative.     Dilation: 3.5 Effacement (%): 70 Station: -2 Exam by:: E. McNulty, RN  Blood pressure 116/72, pulse 94, temperature 98.6 F (37 C), temperature source Oral, resp. rate 22, height 5' 4.5" (1.638 m), weight 86.637 kg (191 lb), last menstrual  period 11/20/2013, SpO2 100.00%. Maternal Exam:  Abdomen: Fundal height is appropriate for gestation.   Estimated fetal weight is 7#.   Fetal presentation: vertex  Introitus: Normal vulva. Normal vagina.  Pelvis: adequate for delivery.   Cervix: Cervix evaluated by digital exam.     Physical Exam  Constitutional: She is oriented to person, place, and time. She appears well-developed and well-nourished.  HENT:  Head: Normocephalic and atraumatic.  Cardiovascular: Normal rate and regular rhythm.   Respiratory: Effort normal. No respiratory distress. She has no wheezes.  GI: Soft. Bowel sounds are normal. She exhibits no distension. There is no tenderness.  Musculoskeletal: Normal range of motion.  Neurological: She is alert and oriented to person, place, and time.  Skin: Skin is warm and dry.  Psychiatric: She has a normal mood and affect. Her behavior is normal.    Prenatal labs: ABO, Rh: B/Positive/-- (04/02 0000) Antibody: Negative (04/02 0000) Rubella: Immune (03/12 0000) RPR: Nonreactive (07/23 0000)  HBsAg: Negative (04/02 0000)  HIV: Non-reactive (07/23 0000)  GBS: Negative (09/18 0000)   Hgb 11.8/Ur Cx neg/ Chl neg/ GC neg/ VI/ AFP WNL/ Plt 266K/ glucola 96  EDC 08/27/14 by LMP and [place]  [place] nl anat - somewhat limited, ant plac F/u completes anat  Assessment/Plan: 23yo G1P0 at 40 for early labor GBBS neg Epidural and IV pain meds Pitocin to augment prn  Bovard-Stuckert, Jamaurion Slemmer 08/27/2014, 8:13 PM

## 2014-08-27 NOTE — Anesthesia Preprocedure Evaluation (Signed)
Anesthesia Evaluation  Patient identified by MRN, date of birth, ID band Patient awake    Reviewed: Allergy & Precautions, H&P , Patient's Chart, lab work & pertinent test results  Airway Mallampati: III TM Distance: >3 FB Neck ROM: Full    Dental no notable dental hx. (+) Teeth Intact   Pulmonary former smoker,  breath sounds clear to auscultation  Pulmonary exam normal       Cardiovascular Rhythm:Regular Rate:Normal     Neuro/Psych negative neurological ROS  negative psych ROS   GI/Hepatic Neg liver ROS, GERD-  Medicated and Controlled,  Endo/Other  Obesity  Renal/GU negative Renal ROS  negative genitourinary   Musculoskeletal negative musculoskeletal ROS (+)   Abdominal (+) + obese,   Peds  Hematology negative hematology ROS (+)   Anesthesia Other Findings   Reproductive/Obstetrics (+) Pregnancy                           Anesthesia Physical Anesthesia Plan  ASA: II  Anesthesia Plan: Epidural   Post-op Pain Management:    Induction:   Airway Management Planned: Natural Airway  Additional Equipment:   Intra-op Plan:   Post-operative Plan:   Informed Consent: I have reviewed the patients History and Physical, chart, labs and discussed the procedure including the risks, benefits and alternatives for the proposed anesthesia with the patient or authorized representative who has indicated his/her understanding and acceptance.     Plan Discussed with: Anesthesiologist  Anesthesia Plan Comments:         Anesthesia Quick Evaluation

## 2014-08-27 NOTE — Anesthesia Procedure Notes (Signed)
Epidural Patient location during procedure: OB Start time: 08/27/2014 9:50 PM  Staffing Anesthesiologist: Deana Krock A. Performed by: anesthesiologist   Preanesthetic Checklist Completed: patient identified, site marked, surgical consent, pre-op evaluation, timeout performed, IV checked, risks and benefits discussed and monitors and equipment checked  Epidural Patient position: sitting Prep: site prepped and draped and DuraPrep Patient monitoring: continuous pulse ox and blood pressure Approach: midline Location: L3-L4 Injection technique: LOR air  Needle:  Needle type: Tuohy  Needle gauge: 17 G Needle length: 9 cm and 9 Needle insertion depth: 5 cm cm Catheter type: closed end flexible Catheter size: 19 Gauge Catheter at skin depth: 10 cm Test dose: negative and Other  Assessment Events: blood not aspirated, injection not painful, no injection resistance, negative IV test and no paresthesia  Additional Notes Patient identified. Risks and benefits discussed including failed block, incomplete  Pain control, post dural puncture headache, nerve damage, paralysis, blood pressure Changes, nausea, vomiting, reactions to medications-both toxic and allergic and post Partum back pain. All questions were answered. Patient expressed understanding and wished to proceed. Sterile technique was used throughout procedure. Epidural site was Dressed with sterile barrier dressing. No paresthesias, signs of intravascular injection Or signs of intrathecal spread were encountered.  Patient was more comfortable after the epidural was dosed. Please see RN's note for documentation of vital signs and FHR which are stable.

## 2014-08-27 NOTE — MAU Note (Signed)
Patient states she has been having contractions for three days. Was seen in the office today and was 2 cm and sent to MAU for evaluation. Denies bleeding or leaking and reports good fetal movement.

## 2014-08-27 NOTE — MAU Note (Signed)
Fetal monitor strip reviewed with Prentice Docker, Charge RN in labor in delivery. Pt. Taken via wheelchair to room 175.

## 2014-08-27 NOTE — Progress Notes (Signed)
Patient ID: Diana Clark, female   DOB: [birth year], 23 y.o.   MRN: [id number]  Getting uncomfortable with ctx.  AFVSS gen NAD  FHTs 140's, good var, category 1 toco q 27min  AROM for clear fluid w/o diff/comp SVE 4.5/90/0  Pitocin if no cervical change Epidural for pain

## 2014-08-28 ENCOUNTER — Encounter (HOSPITAL_COMMUNITY): Payer: Self-pay | Admitting: Obstetrics and Gynecology

## 2014-08-28 LAB — ABO/RH: ABO/RH(D): B POS

## 2014-08-28 LAB — RPR

## 2014-08-28 MED ORDER — SENNOSIDES-DOCUSATE SODIUM 8.6-50 MG PO TABS
2.0000 | ORAL_TABLET | ORAL | Status: DC
  Administered 2014-08-28: 2 via ORAL
  Filled 2014-08-28: qty 2

## 2014-08-28 MED ORDER — DIBUCAINE 1 % RE OINT: 1.0000 "application " | TOPICAL_OINTMENT | RECTAL | Status: DC | PRN

## 2014-08-28 MED ORDER — PRENATAL MULTIVITAMIN CH
1.0000 | ORAL_TABLET | Freq: Every day | ORAL | Status: DC
  Administered 2014-08-28 – 2014-08-29 (×2): 1 via ORAL
  Filled 2014-08-28 (×2): qty 1

## 2014-08-28 MED ORDER — ONDANSETRON HCL 4 MG/2ML IJ SOLN: 4.0000 mg | INTRAMUSCULAR | Status: DC | PRN

## 2014-08-28 MED ORDER — OXYCODONE-ACETAMINOPHEN 5-325 MG PO TABS: 1.0000 | ORAL_TABLET | ORAL | Status: DC | PRN

## 2014-08-28 MED ORDER — DIPHENHYDRAMINE HCL 25 MG PO CAPS
25.0000 mg | ORAL_CAPSULE | Freq: Four times a day (QID) | ORAL | Status: DC | PRN
Start: 2014-08-28 — End: 2014-08-29

## 2014-08-28 MED ORDER — ZOLPIDEM TARTRATE 5 MG PO TABS: 5.0000 mg | ORAL_TABLET | Freq: Every evening | ORAL | Status: DC | PRN

## 2014-08-28 MED ORDER — LANOLIN HYDROUS EX OINT: TOPICAL_OINTMENT | CUTANEOUS | Status: DC | PRN

## 2014-08-28 MED ORDER — BENZOCAINE-MENTHOL 20-0.5 % EX AERO
1.0000 "application " | INHALATION_SPRAY | CUTANEOUS | Status: DC | PRN
  Filled 2014-08-28: qty 56

## 2014-08-28 MED ORDER — SIMETHICONE 80 MG PO CHEW: 80.0000 mg | CHEWABLE_TABLET | ORAL | Status: DC | PRN

## 2014-08-28 MED ORDER — CALCIUM CARBONATE ANTACID 500 MG PO CHEW: 2.0000 | CHEWABLE_TABLET | Freq: Every day | ORAL | Status: DC | PRN

## 2014-08-28 MED ORDER — IBUPROFEN 600 MG PO TABS
600.0000 mg | ORAL_TABLET | Freq: Four times a day (QID) | ORAL | Status: DC
  Administered 2014-08-28 – 2014-08-29 (×5): 600 mg via ORAL
  Filled 2014-08-28 (×5): qty 1

## 2014-08-28 MED ORDER — OXYCODONE-ACETAMINOPHEN 5-325 MG PO TABS: 2.0000 | ORAL_TABLET | ORAL | Status: DC | PRN

## 2014-08-28 MED ORDER — ONDANSETRON HCL 4 MG PO TABS: 4.0000 mg | ORAL_TABLET | ORAL | Status: DC | PRN

## 2014-08-28 MED ORDER — TETANUS-DIPHTH-ACELL PERTUSSIS 5-2.5-18.5 LF-MCG/0.5 IM SUSP: 0.5000 mL | Freq: Once | INTRAMUSCULAR | Status: DC

## 2014-08-28 MED ORDER — WITCH HAZEL-GLYCERIN EX PADS: 1.0000 "application " | MEDICATED_PAD | CUTANEOUS | Status: DC | PRN

## 2014-08-28 NOTE — Anesthesia Postprocedure Evaluation (Signed)
  Anesthesia Post-op Note  Patient: Diana Clark  Procedure(s) Performed: * No procedures listed *  Patient Location: PACU and Mother/Baby  Anesthesia Type:Epidural  Level of Consciousness: awake, alert  and oriented  Airway and Oxygen Therapy: Patient Spontanous Breathing  Post-op Pain: mild  Post-op Assessment: Patient's Cardiovascular Status Stable, Respiratory Function Stable, No signs of Nausea or vomiting, Adequate PO intake, Pain level controlled, No headache, No backache, No residual numbness and No residual motor weakness  Post-op Vital Signs: Reviewed and stable  Last Vitals:  Filed Vitals:   08/28/14 1420  BP: 104/63  Pulse: 94  Temp: 37 C  Resp: 18    Complications: No apparent anesthesia complications

## 2014-08-28 NOTE — Lactation Note (Signed)
This note was copied from the chart of [place]. Lactation Consultation Note Initial visit made.  Breastfeeding consultation services and support information given to patient.  Mom states she desires to pump breasts and bottle feed only.  DEBP has been set up and initiated by RN.  Mom states she did not obtain colostrum.  Reassured and reviewed normal supply and demand.  Instructed to pump every 3 hours x 15 minutes.  Mom does not have a pump at home.  Formula brought to mom and volume parameters reviewed to give until milk obtained.  Encouraged to call with concerns/questions prn.  Patient Name: Diana Clark [id number] Date: 08/28/2014 Reason for consult: Initial assessment   Maternal Data Does the patient have breastfeeding experience prior to this delivery?: No  Feeding    LATCH Score/Interventions                      Lactation Tools Discussed/Used Pump Review: Setup, frequency, and cleaning Initiated by:: RN Date initiated:: 08/28/14   Consult Status Consult Status: PRN    Ave Filter 08/28/2014, 1:54 PM

## 2014-08-29 ENCOUNTER — Encounter (HOSPITAL_COMMUNITY): Payer: Self-pay

## 2014-08-29 LAB — CBC
HCT: 33.5 % — ABNORMAL LOW (ref 36.0–46.0)
HEMOGLOBIN: 11.5 g/dL — AB (ref 12.0–15.0)
MCH: 30.7 pg (ref 26.0–34.0)
MCHC: 34.3 g/dL (ref 30.0–36.0)
MCV: 89.3 fL (ref 78.0–100.0)
Platelets: 190 10*3/uL (ref 150–400)
RBC: 3.75 MIL/uL — ABNORMAL LOW (ref 3.87–5.11)
RDW: 13.9 % (ref 11.5–15.5)
WBC: 14.7 10*3/uL — ABNORMAL HIGH (ref 4.0–10.5)

## 2014-08-29 MED ORDER — OXYCODONE-ACETAMINOPHEN 5-325 MG PO TABS: 1.0000 | ORAL_TABLET | Freq: Four times a day (QID) | ORAL | Status: DC | PRN

## 2014-08-29 MED ORDER — PRENATAL MULTIVITAMIN CH: 1.0000 | ORAL_TABLET | Freq: Every day | ORAL | Status: DC

## 2014-08-29 MED ORDER — IBUPROFEN 800 MG PO TABS: 800.0000 mg | ORAL_TABLET | Freq: Three times a day (TID) | ORAL | Status: DC | PRN

## 2014-08-29 NOTE — Discharge Summary (Signed)
Obstetric Discharge Summary Reason for Admission: onset of labor Prenatal Procedures: none Intrapartum Procedures: spontaneous vaginal delivery Postpartum Procedures: none Complications-Operative and Postpartum: vaginal laceration Hemoglobin  Date Value Ref Range Status  08/29/2014 11.5* 12.0 - 15.0 g/dL Final     HCT  Date Value Ref Range Status  08/29/2014 33.5* 36.0 - 46.0 % Final    Physical Exam:  General: alert and no distress Lochia: appropriate Uterine Fundus: firm  Discharge Diagnoses: Term Pregnancy-delivered  Discharge Information: Date: 08/29/2014 Activity: pelvic rest Diet: routine Medications: PNV, Ibuprofen and Percocet Condition: stable Instructions: refer to practice specific booklet Discharge to: home Follow-up Information   Schedule an appointment as soon as possible for a visit with Diana Contes, MD. (for postpartum check)    Specialty:  Obstetrics and Gynecology   Contact information:   [address] [address] [phone]       Newborn Data: Live born female  Birth Weight: 8 lb 5.2 oz (3776 g) APGAR: 9, 9  Home with mother.  Bovard-Stuckert, Diana Clark 08/29/2014, 9:01 AM

## 2014-08-29 NOTE — Progress Notes (Addendum)
Post Partum Day 1 Subjective: no complaints, up ad lib, voiding, tolerating PO and nl lochia, pain controlled  Objective: Blood pressure 110/71, pulse 87, temperature 97.8 F (36.6 C), temperature source Oral, resp. rate 20, height 5' 4.5" (1.638 m), weight 86.637 kg (191 lb), last menstrual period 11/20/2013, SpO2 98.00%.  Physical Exam:  General: alert and no distress Lochia: appropriate Uterine Fundus: firm   Recent Labs  08/27/14 1830 08/29/14 0604  HGB 13.4 11.5*  HCT 38.7 33.5*    Assessment/Plan: Plan for discharge tomorrow, Breastfeeding and Lactation consult.  Routine care.  Desires to be discharged today.  D/C with motrin, percocet, and PNV.  F/u 6 weeks.   LOS: 2 days   Bovard-Stuckert, Zadok Holaway 08/29/2014, 8:30 AM

## 2014-08-29 NOTE — Lactation Note (Addendum)
This note was copied from the chart of Diana Clark. Lactation Consultation Note: Mom for DC. Reports that she pumped 3 times yesterday and once so far today but is not obtaining any Colostrum. Reassurance given. Reviewed supply and demand. Has been giving bottles of formula. When asked about her plans for pump for home use- mom states she does not know. Reports that she has called [employer]IC but they did not answer- left message. Offered pump rental from [place] but mom declined. No questions at present. To call prn  Patient Name: Diana Clark [id number] Date: 08/29/2014 Reason for consult: Follow-up assessment   Maternal Data Formula Feeding for Exclusion: Yes Reason for exclusion: Mother's choice to formula and breast feed on admission Does the patient have breastfeeding experience prior to this delivery?: No  Feeding    LATCH Score/Interventions                      Lactation Tools Discussed/Used     Consult Status Consult Status: Complete    Diana Clark 08/29/2014, 11:20 AM

## 2014-08-29 NOTE — Progress Notes (Signed)
Only bottle feeding at present time  Discussed breast and bottle feeding with patient

## 2014-09-05 ENCOUNTER — Encounter (HOSPITAL_COMMUNITY): Payer: Self-pay | Admitting: *Deleted

## 2014-09-16 ENCOUNTER — Encounter (HOSPITAL_COMMUNITY): Payer: Self-pay | Admitting: *Deleted

## 2015-04-21 ENCOUNTER — Inpatient Hospital Stay (HOSPITAL_COMMUNITY)
Admission: AD | Admit: 2015-04-21 | Discharge: 2015-04-21 | Discharge status: Against Medical Advice | Payer: Medicaid Other | Source: Ambulatory Visit | Attending: Obstetrics and Gynecology | Admitting: Obstetrics and Gynecology

## 2015-04-21 NOTE — MAU Note (Signed)
Per registration, pt left. Not in lobby.

## 2015-04-28 ENCOUNTER — Encounter (HOSPITAL_COMMUNITY): Payer: Self-pay | Admitting: *Deleted

## 2015-04-28 ENCOUNTER — Emergency Department (INDEPENDENT_AMBULATORY_CARE_PROVIDER_SITE_OTHER)
Admission: EM | Admit: 2015-04-28 | Discharge: 2015-04-28 | Disposition: A | Discharge status: Home / Self Care | Payer: Self-pay | Source: Home / Self Care | Attending: Family Medicine | Admitting: Family Medicine

## 2015-04-28 DIAGNOSIS — L503 Dermatographic urticaria: Secondary | ICD-10-CM

## 2015-04-28 MED ORDER — METHYLPREDNISOLONE ACETATE 80 MG/ML IJ SUSP
80.0000 mg | Freq: Once | INTRAMUSCULAR | Status: AC
  Administered 2015-04-28: 80 mg via INTRAMUSCULAR

## 2015-04-28 MED ORDER — FAMOTIDINE 40 MG PO TABS: 40.0000 mg | ORAL_TABLET | Freq: Every day | ORAL | Status: DC

## 2015-04-28 MED ORDER — METHYLPREDNISOLONE ACETATE 80 MG/ML IJ SUSP
INTRAMUSCULAR | Status: AC
  Filled 2015-04-28: qty 1

## 2015-04-28 MED ORDER — HYDROXYZINE HCL 25 MG PO TABS: 25.0000 mg | ORAL_TABLET | Freq: Four times a day (QID) | ORAL | Status: DC

## 2015-04-28 NOTE — Discharge Instructions (Signed)
Use medicine as needed for itching and whelps, see dermatologist if further problems

## 2015-04-28 NOTE — ED Notes (Signed)
Pt  Reports  Symptoms  Of  Rash        With  Itching        Whelps     X  2  Weeks  Getting  Worse  - symptoms  Not  releived  By otc  meds         -  Pt  Sitting  Upright on  The  Exam table  Speaking in   Complete  sentances  And  Is         In no   Severe    Distress

## 2015-04-28 NOTE — ED Provider Notes (Signed)
CSN: [id number]     Arrival date & time 04/28/15  60 History   First MD Initiated Contact with Patient 04/28/15 1805     Chief Complaint  Patient presents with  . Rash   (Consider location/radiation/quality/duration/timing/severity/associated sxs/prior Treatment) Patient is a 24 y.o. female presenting with rash. The history is provided by the patient.  Rash Location:  Full body Quality: itchiness   Severity:  Moderate Onset quality:  Gradual Duration:  2 weeks Progression:  Unchanged Chronicity:  Recurrent (similar episode 2-3 yrs ago.) Relieved by:  None tried Worsened by:  Nothing tried Ineffective treatments:  None tried Associated symptoms: no fever, no shortness of breath, no throat swelling, no tongue swelling and not wheezing     Past Medical History  Diagnosis Date  . Medical history non-contributory   . SVD (spontaneous vaginal delivery) 08/28/2014   Past Surgical History  Procedure Laterality Date  . Incise and drain abcess  2005    located on the right buttocks  . No past surgeries     Family History  Problem Relation Age of Onset  . Hypertension Mother   . Hypertension Paternal Grandmother    History  Substance Use Topics  . Smoking status: Former Research scientist (life sciences)  . Smokeless tobacco: Former Systems developer    Quit date: 01/05/2014  . Alcohol Use: No   OB History    Gravida Para Term Preterm AB TAB SAB Ectopic Multiple Living   2 1 1       1      Review of Systems  Constitutional: Negative.  Negative for fever.  Respiratory: Negative for shortness of breath and wheezing.   Skin: Positive for rash.    Allergies  Review of patient's allergies indicates no known allergies.  Home Medications   Prior to Admission medications   Medication Sig Start Date End Date Taking? Authorizing Provider  calcium carbonate (TUMS - DOSED IN MG ELEMENTAL CALCIUM) 500 MG chewable tablet Chew 2 tablets by mouth daily as needed for indigestion or heartburn.    Historical Provider, MD   famotidine (PEPCID) 40 MG tablet Take 1 tablet (40 mg total) by mouth daily. 04/28/15   Billy Fischer, MD  hydrOXYzine (ATARAX/VISTARIL) 25 MG tablet Take 1 tablet (25 mg total) by mouth every 6 (six) hours. Prn itching 04/28/15   Billy Fischer, MD  ibuprofen (ADVIL,MOTRIN) 800 MG tablet Take 1 tablet (800 mg total) by mouth every 8 (eight) hours as needed. 08/29/14   [name], MD  oxyCODONE-acetaminophen (PERCOCET/ROXICET) 5-325 MG per tablet Take 1-2 tablets by mouth every 6 (six) hours as needed (for pain scale less than 7). 08/29/14   [name], MD  Prenatal Vit-Fe Fumarate-FA (PRENATAL MULTIVITAMIN) TABS tablet Take 1 tablet by mouth daily. 08/29/14   Jody Bovard-Stuckert, MD   BP 107/71 mmHg  Pulse 80  Temp(Src) 99.1 F (37.3 C) (Oral)  Resp 16  SpO2 98%  LMP 04/11/2015 Physical Exam  Constitutional: She is oriented to person, place, and time. She appears well-developed and well-nourished.  Pulmonary/Chest: Effort normal and breath sounds normal.  Musculoskeletal: She exhibits no tenderness.  Lymphadenopathy:    She has no cervical adenopathy.  Neurological: She is alert and oriented to person, place, and time.  Skin: Skin is warm and dry. Rash noted.  Linear dermatographic urticarial diffuse rash  Nursing note and vitals reviewed.   ED Course  Procedures (including critical care time) Labs Review Labs Reviewed - No data to display  Imaging Review No results  found.   MDM   1. Urticaria, dermatographic        Billy Fischer, MD 04/29/15 2015

## 2017-07-05 ENCOUNTER — Encounter (HOSPITAL_COMMUNITY): Payer: Self-pay | Admitting: *Deleted

## 2017-07-05 ENCOUNTER — Inpatient Hospital Stay (HOSPITAL_COMMUNITY)
Admission: AD | Admit: 2017-07-05 | Discharge: 2017-07-05 | Disposition: A | Discharge status: Home / Self Care | Payer: Medicaid Other | Source: Ambulatory Visit | Attending: Family Medicine | Admitting: Family Medicine

## 2017-07-05 ENCOUNTER — Inpatient Hospital Stay (HOSPITAL_COMMUNITY): Payer: Medicaid Other

## 2017-07-05 DIAGNOSIS — N760 Acute vaginitis: Secondary | ICD-10-CM | POA: Diagnosis not present

## 2017-07-05 DIAGNOSIS — M549 Dorsalgia, unspecified: Secondary | ICD-10-CM | POA: Diagnosis not present

## 2017-07-05 DIAGNOSIS — R109 Unspecified abdominal pain: Secondary | ICD-10-CM | POA: Insufficient documentation

## 2017-07-05 DIAGNOSIS — J3489 Other specified disorders of nose and nasal sinuses: Secondary | ICD-10-CM | POA: Insufficient documentation

## 2017-07-05 DIAGNOSIS — B9689 Other specified bacterial agents as the cause of diseases classified elsewhere: Secondary | ICD-10-CM | POA: Diagnosis not present

## 2017-07-05 DIAGNOSIS — Z3A01 Less than 8 weeks gestation of pregnancy: Secondary | ICD-10-CM

## 2017-07-05 DIAGNOSIS — R067 Sneezing: Secondary | ICD-10-CM | POA: Insufficient documentation

## 2017-07-05 DIAGNOSIS — O26891 Other specified pregnancy related conditions, first trimester: Secondary | ICD-10-CM | POA: Diagnosis not present

## 2017-07-05 DIAGNOSIS — O26899 Other specified pregnancy related conditions, unspecified trimester: Secondary | ICD-10-CM

## 2017-07-05 DIAGNOSIS — Z79899 Other long term (current) drug therapy: Secondary | ICD-10-CM | POA: Diagnosis not present

## 2017-07-05 DIAGNOSIS — Z87891 Personal history of nicotine dependence: Secondary | ICD-10-CM | POA: Diagnosis not present

## 2017-07-05 DIAGNOSIS — O26892 Other specified pregnancy related conditions, second trimester: Secondary | ICD-10-CM | POA: Insufficient documentation

## 2017-07-05 DIAGNOSIS — O3680X0 Pregnancy with inconclusive fetal viability, not applicable or unspecified: Secondary | ICD-10-CM

## 2017-07-05 LAB — URINALYSIS, ROUTINE W REFLEX MICROSCOPIC
Bilirubin Urine: NEGATIVE
Glucose, UA: NEGATIVE mg/dL
HGB URINE DIPSTICK: NEGATIVE
Ketones, ur: NEGATIVE mg/dL
Leukocytes, UA: NEGATIVE
Nitrite: NEGATIVE
Protein, ur: NEGATIVE mg/dL
Specific Gravity, Urine: 1.019 (ref 1.005–1.030)
pH: 6 (ref 5.0–8.0)

## 2017-07-05 LAB — WET PREP, GENITAL
Trich, Wet Prep: NONE SEEN
YEAST WET PREP: NONE SEEN

## 2017-07-05 LAB — CBC
HCT: 39.4 % (ref 36.0–46.0)
Hemoglobin: 13.5 g/dL (ref 12.0–15.0)
MCH: 31.8 pg (ref 26.0–34.0)
MCHC: 34.3 g/dL (ref 30.0–36.0)
MCV: 92.9 fL (ref 78.0–100.0)
Platelets: 281 10*3/uL (ref 150–400)
RBC: 4.24 MIL/uL (ref 3.87–5.11)
RDW: 13.5 % (ref 11.5–15.5)
WBC: 11.6 10*3/uL — ABNORMAL HIGH (ref 4.0–10.5)

## 2017-07-05 LAB — POCT PREGNANCY, URINE: Preg Test, Ur: POSITIVE — AB

## 2017-07-05 LAB — HCG, QUANTITATIVE, PREGNANCY: HCG, BETA CHAIN, QUANT, S: 963 m[IU]/mL — AB (ref <5)

## 2017-07-05 MED ORDER — METRONIDAZOLE 500 MG PO TABS: 500.0000 mg | ORAL_TABLET | Freq: Three times a day (TID) | ORAL | 0 refills | Status: AC

## 2017-07-05 NOTE — Discharge Instructions (Signed)
Ectopic Pregnancy °An ectopic pregnancy happens when a fertilized egg grows outside the uterus. A pregnancy cannot live outside of the uterus. This problem often happens in the fallopian tube. It is often caused by damage to the fallopian tube. °If this problem is found early, you may be treated with medicine. If your tube tears or bursts open (ruptures), you will bleed inside. This is an emergency. You will need surgery. Get help right away. °What are the signs or symptoms? °You may have normal pregnancy symptoms at first. These include: °· Missing your period. °· Feeling sick to your stomach (nauseous). °· Being tired. °· Having tender breasts. ° °Then, you may start to have symptoms that are not normal. These include: °· Pain with sex (intercourse). °· Bleeding from the vagina. This includes light bleeding (spotting). °· Belly (abdomen) or lower belly cramping or pain. This may be felt on one side. °· A fast heartbeat (pulse). °· Passing out (fainting) after going poop (bowel movement). ° °If your tube tears, you may have symptoms such as: °· Really bad pain in the belly or lower belly. This happens suddenly. °· Dizziness. °· Passing out. °· Shoulder pain. ° °Get help right away if: °You have any of these symptoms. This is an emergency. °This information is not intended to replace advice given to you by your health care provider. Make sure you discuss any questions you have with your health care provider. °Document Released: 01/28/2009 Document Revised: 04/08/2016 Document Reviewed: 06/13/2013 °Elsevier Interactive Patient Education © 2017 [employer]vier Inc. ° °

## 2017-07-05 NOTE — MAU Note (Signed)
Pt reports lower abd pain, lower back pain x one week. Denies dysuria.

## 2017-07-05 NOTE — MAU Provider Note (Signed)
Patient Diana Clark is a 26 y.o. G3P1001 at [redacted]w[redacted]d here with complaints of abdominal pain for the past week plus she has a runny nose and has had some watery stools in the past three days.   Her last period was in July; she has not had a pregnancy test yet.  She denies dysuria, bleeding, urgency, abnormal discharge or any other ob-gyn complaints.  History     CSN: [id number]  Arrival date and time: 07/05/17 1318   None     Chief Complaint  Patient presents with  . Abdominal Pain  . Back Pain   Abdominal Pain  This is a new problem. The current episode started in the past 7 days. The onset quality is sudden. The problem occurs intermittently. The problem has been unchanged. The pain is at a severity of 6/10. The quality of the pain is cramping. Pertinent negatives include no headaches. Associated symptoms comments: Loose stools 3-4 times per day but no diarrhea. . Nothing aggravates the pain. The pain is relieved by nothing. She has tried nothing for the symptoms.  URI   This is a recurrent problem. The current episode started in the past 7 days. The problem has been unchanged. There has been no fever. Associated symptoms include abdominal pain, congestion, rhinorrhea and sneezing. Pertinent negatives include no headaches, sore throat or swollen glands. She has tried nothing for the symptoms.    OB History    Gravida Para Term Preterm AB Living   3 1 1     1    SAB TAB Ectopic Multiple Live Births           1      Past Medical History:  Diagnosis Date  . Medical history non-contributory   . SVD (spontaneous vaginal delivery) 08/28/2014    Past Surgical History:  Procedure Laterality Date  . INCISE AND DRAIN ABCESS  2005   located on the right buttocks  . NO PAST SURGERIES      Family History  Problem Relation Age of Onset  . Hypertension Mother   . Hypertension Paternal Grandmother     Social History  Substance Use Topics  . Smoking status: Former Smoker    Types:  Cigarettes  . Smokeless tobacco: Former Systems developer    Quit date: 01/05/2014  . Alcohol use Yes    Allergies: No Known Allergies  Prescriptions Prior to Admission  Medication Sig Dispense Refill Last Dose  . calcium carbonate (TUMS - DOSED IN MG ELEMENTAL CALCIUM) 500 MG chewable tablet Chew 2 tablets by mouth daily as needed for indigestion or heartburn.   08/27/2014 at Unknown time  . famotidine (PEPCID) 40 MG tablet Take 1 tablet (40 mg total) by mouth daily. 30 tablet 1   . hydrOXYzine (ATARAX/VISTARIL) 25 MG tablet Take 1 tablet (25 mg total) by mouth every 6 (six) hours. Prn itching 60 tablet 0   . ibuprofen (ADVIL,MOTRIN) 800 MG tablet Take 1 tablet (800 mg total) by mouth every 8 (eight) hours as needed. 45 tablet 1   . oxyCODONE-acetaminophen (PERCOCET/ROXICET) 5-325 MG per tablet Take 1-2 tablets by mouth every 6 (six) hours as needed (for pain scale less than 7). 15 tablet 0   . Prenatal Vit-Fe Fumarate-FA (PRENATAL MULTIVITAMIN) TABS tablet Take 1 tablet by mouth daily. 30 tablet 12     Review of Systems  Constitutional: Negative.   HENT: Positive for congestion, rhinorrhea and sneezing. Negative for sore throat.   Respiratory: Negative.   Cardiovascular: Negative.  Gastrointestinal: Positive for abdominal pain.  Genitourinary: Negative.   Neurological: Negative.  Negative for headaches.   Physical Exam   Blood pressure 124/78, pulse 98, temperature 98.1 F (36.7 C), temperature source Oral, resp. rate 19, height 5\' 4"  (1.626 m), weight 177 lb (80.3 kg), last menstrual period 05/28/2017, SpO2 98 %, unknown if currently breastfeeding.  Physical Exam  Constitutional: She is oriented to person, place, and time. She appears well-developed and well-nourished.  HENT:  Head: Normocephalic.  Nose: Nose normal.  Mouth/Throat: Oropharynx is clear and moist. No oropharyngeal exudate.  TM gray; non-bulging, no erythema. Nares slightly redded, no exudate in nares or throat. No  lymphadenopathy.   Eyes: Pupils are equal, round, and reactive to light. Right eye exhibits no discharge. Left eye exhibits no discharge.  Neck: Normal range of motion.  Respiratory: Effort normal.  GI: Soft.  Genitourinary: No vaginal discharge found.  Genitourinary Comments: NEFG; no lesions on external or internal genitalia. Cervix is pink with no lesions, no CMT, adnexal tenderness. Mild suprapubic tenderness.   Musculoskeletal: Normal range of motion.  Neurological: She is alert and oriented to person, place, and time.  Skin: Skin is warm and dry.  Psychiatric: She has a normal mood and affect.    MAU Course  Procedures  MDM -CBC -ABO -beta HCG-960 -wet prep: positive for clue US Ob Comp Less 14 Wks  Result Date: 07/05/2017 CLINICAL DATA:  Abdominal pain and pregnancy. Gestational age by LMP of 5 weeks 3 days. EXAM: OBSTETRIC <14 WK [place] AND TRANSVAGINAL OB US TECHNIQUE: Both transabdominal and transvaginal ultrasound examinations were performed for complete evaluation of the gestation as well as the maternal uterus, adnexal regions, and pelvic cul-de-sac. Transvaginal technique was performed to assess early pregnancy. COMPARISON:  None. FINDINGS: Intrauterine gestational sac: Single ; irregular shape noted Yolk sac:  Not Visualized. Embryo:  Not Visualized. MSD: 4  mm   5 w   0  d Subchorionic hemorrhage: Moderate to large subchorionic hemorrhage noted. Maternal uterus/adnexae: 2.0 cm intramural fibroid in posterior corpus. Small right ovarian corpus luteum noted. Normal appearance of left ovary. No adnexal mass or abnormal free fluid identified. IMPRESSION: Probable single early intrauterine gestational sac with estimated gestational age of [redacted] weeks 0 days by mean sac diameter. Recommend follow-up quantitative B-HCG levels and follow-up US in 14 days to assess viability. This recommendation follows SRU consensus guidelines: Diagnostic Criteria for Nonviable Pregnancy Early in the First  Trimester. [employer] Med 2013; 025:4270-62. Irregular sac shape and subchorionic hemorrhage are poor prognostic findings. 2 cm posterior uterine fibroid. Electronically Signed   By: Earle Gell M.D.   On: 07/05/2017 16:40   US Ob Transvaginal  Result Date: 07/05/2017 CLINICAL DATA:  Abdominal pain and pregnancy. Gestational age by LMP of 5 weeks 3 days. EXAM: OBSTETRIC <14 WK [place] AND TRANSVAGINAL OB US TECHNIQUE: Both transabdominal and transvaginal ultrasound examinations were performed for complete evaluation of the gestation as well as the maternal uterus, adnexal regions, and pelvic cul-de-sac. Transvaginal technique was performed to assess early pregnancy. COMPARISON:  None. FINDINGS: Intrauterine gestational sac: Single ; irregular shape noted Yolk sac:  Not Visualized. Embryo:  Not Visualized. MSD: 4  mm   5 w   0  d Subchorionic hemorrhage: Moderate to large subchorionic hemorrhage noted. Maternal uterus/adnexae: 2.0 cm intramural fibroid in posterior corpus. Small right ovarian corpus luteum noted. Normal appearance of left ovary. No adnexal mass or abnormal free fluid identified. IMPRESSION: Probable single early intrauterine gestational  sac with estimated gestational age of [redacted] weeks 0 days by mean sac diameter. Recommend follow-up quantitative B-HCG levels and follow-up US in 14 days to assess viability. This recommendation follows SRU consensus guidelines: Diagnostic Criteria for Nonviable Pregnancy Early in the First Trimester. [employer] Med 2013; 973:5329-92. Irregular sac shape and subchorionic hemorrhage are poor prognostic findings. 2 cm posterior uterine fibroid. Electronically Signed   By: Earle Gell M.D.   On: 07/05/2017 16:40     Assessment and Plan   1. Pregnancy of unknown anatomic location   2. Abdominal pain in pregnancy   3. Bacterial vaginosis    2. Patient stable for discharge with strict ectopic precautions and RX for Flagyl.  3. Patient to return to clinic in 2 days for  follow up betaHCG.  4. Patient and partner verbalized understanding; all questions answered.   Mervyn Skeeters Greg Cratty 07/05/2017, 3:00 PM

## 2017-07-06 LAB — GC/CHLAMYDIA PROBE AMP (~~LOC~~) NOT AT ARMC
Chlamydia: NEGATIVE
Neisseria Gonorrhea: NEGATIVE

## 2017-07-07 ENCOUNTER — Other Ambulatory Visit: Payer: Self-pay

## 2017-07-07 ENCOUNTER — Inpatient Hospital Stay (HOSPITAL_COMMUNITY)
Admission: AD | Admit: 2017-07-07 | Discharge: 2017-07-07 | Disposition: A | Discharge status: Home / Self Care | Payer: Medicaid Other | Source: Ambulatory Visit | Attending: Obstetrics & Gynecology | Admitting: Obstetrics & Gynecology

## 2017-07-07 DIAGNOSIS — Z87891 Personal history of nicotine dependence: Secondary | ICD-10-CM | POA: Insufficient documentation

## 2017-07-07 DIAGNOSIS — O3680X0 Pregnancy with inconclusive fetal viability, not applicable or unspecified: Secondary | ICD-10-CM | POA: Diagnosis not present

## 2017-07-07 DIAGNOSIS — Z8249 Family history of ischemic heart disease and other diseases of the circulatory system: Secondary | ICD-10-CM | POA: Diagnosis not present

## 2017-07-07 LAB — HCG, QUANTITATIVE, PREGNANCY: HCG, BETA CHAIN, QUANT, S: 2674 m[IU]/mL — AB (ref <5)

## 2017-07-07 NOTE — MAU Provider Note (Signed)
Patient Diana Clark is a 26 y.o. G3P1001 At [redacted]w[redacted]d here for follow-up beta hcg. Patient still having some off and on again abdominal pains but denies bleeding. The pain is mostly suprapubic and in her lower back.  History     CSN: [id number]  Arrival date and time: 07/07/17 1144   None     Chief Complaint  Patient presents with  . Follow-up   HPI  OB History    Gravida Para Term Preterm AB Living   3 1 1     1    SAB TAB Ectopic Multiple Live Births           1      Past Medical History:  Diagnosis Date  . Medical history non-contributory   . SVD (spontaneous vaginal delivery) 08/28/2014    Past Surgical History:  Procedure Laterality Date  . INCISE AND DRAIN ABCESS  2005   located on the right buttocks  . NO PAST SURGERIES      Family History  Problem Relation Age of Onset  . Hypertension Mother   . Hypertension Paternal Grandmother     Social History  Substance Use Topics  . Smoking status: Former Smoker    Types: Cigarettes  . Smokeless tobacco: Former Systems developer    Quit date: 01/05/2014  . Alcohol use Yes    Allergies: No Known Allergies  No prescriptions prior to admission.    Review of Systems  Constitutional: Negative.   Respiratory: Negative.   Cardiovascular: Negative.   Gastrointestinal: Positive for abdominal pain.  Genitourinary: Negative.   Musculoskeletal: Negative.    Physical Exam   Blood pressure 132/70, pulse 82, temperature 98.2 F (36.8 C), temperature source Oral, resp. rate 19, height 5\' 4"  (1.626 m), weight 177 lb (80.3 kg), last menstrual period 05/28/2017, SpO2 100 %, unknown if currently breastfeeding.  Physical Exam  Constitutional: She is oriented to person, place, and time. She appears well-developed and well-nourished.  HENT:  Head: Normocephalic.  Eyes: Pupils are equal, round, and reactive to light.  Neck: Normal range of motion.  Respiratory: Effort normal.  GI: Soft. Bowel sounds are normal.  Musculoskeletal:  Normal range of motion.  Neurological: She is alert and oriented to person, place, and time. She has normal reflexes.  Skin: Skin is warm and dry.  Psychiatric: She has a normal mood and affect.    MAU Course  Procedures  MDM -beta rise from 960 on 8-21 to 2600 on 8-23  Assessment and Plan   1. Pregnancy of unknown anatomic location    2. Appropriate rise in quant; will schedule [place] for next Thursday for follow-up.  3. Patient given strict ectopic precautions; patient verbalized understanding.   Mervyn Skeeters Kemiyah Tarazon CNM 07/07/2017, 3:15 PM

## 2017-07-07 NOTE — MAU Note (Signed)
Patient here for follow up HCG. Denies any pain or vaginal bleeding at this time.

## 2017-07-14 ENCOUNTER — Ambulatory Visit (INDEPENDENT_AMBULATORY_CARE_PROVIDER_SITE_OTHER): Payer: Self-pay | Admitting: *Deleted

## 2017-07-14 ENCOUNTER — Ambulatory Visit (HOSPITAL_COMMUNITY)
Admission: RE | Admit: 2017-07-14 | Discharge: 2017-07-14 | Disposition: A | Discharge status: Home / Self Care | Payer: Medicaid Other | Source: Ambulatory Visit | Attending: Student | Admitting: Student

## 2017-07-14 DIAGNOSIS — Z3A01 Less than 8 weeks gestation of pregnancy: Secondary | ICD-10-CM | POA: Diagnosis not present

## 2017-07-14 DIAGNOSIS — Z712 Person consulting for explanation of examination or test findings: Secondary | ICD-10-CM

## 2017-07-14 DIAGNOSIS — O3680X0 Pregnancy with inconclusive fetal viability, not applicable or unspecified: Secondary | ICD-10-CM

## 2017-07-14 DIAGNOSIS — Z7189 Other specified counseling: Secondary | ICD-10-CM

## 2017-07-14 DIAGNOSIS — O3411 Maternal care for benign tumor of corpus uteri, first trimester: Secondary | ICD-10-CM | POA: Insufficient documentation

## 2017-07-14 DIAGNOSIS — Z3490 Encounter for supervision of normal pregnancy, unspecified, unspecified trimester: Secondary | ICD-10-CM | POA: Diagnosis present

## 2017-07-14 NOTE — Progress Notes (Signed)
I discussed the patient's case with Dr. Roselie Awkward, who agrees with plan of care. I agree with nursing notes.

## 2017-07-14 NOTE — Progress Notes (Signed)
Patient presents to clinic for u/s results. Results reviewed by Maye Hides, CNM. Orders received for b-hcg today and f/u US in one week. U/s scheduled for 9/7 @ 2pm. Patient informed of results and recommendations. Understanding voiced. To lab for blood draw.

## 2017-07-15 ENCOUNTER — Telehealth: Payer: Self-pay | Admitting: *Deleted

## 2017-07-15 LAB — BETA HCG QUANT (REF LAB): hCG Quant: 17749 m[IU]/mL

## 2017-07-15 NOTE — Telephone Encounter (Signed)
Called patient with beta-hcg results which have shown a good rise. Patient was very appreciative of the call. Patient to return in a week for u/s.

## 2017-07-15 NOTE — Progress Notes (Signed)
Reviewed beta hcg results from 07-14-2017. Patient has appropriate rise in quant from 8-23 to 8-30. Patient should keep [place] appt next week; message sent to clinic staff to call her with beta hcg results.

## 2017-07-19 ENCOUNTER — Ambulatory Visit (HOSPITAL_COMMUNITY): Admission: RE | Admit: 2017-07-19 | Payer: Self-pay | Source: Ambulatory Visit

## 2017-07-19 ENCOUNTER — Telehealth: Payer: Self-pay | Admitting: General Practice

## 2017-07-19 NOTE — Telephone Encounter (Signed)
Patient called and left message requesting bhcg results. Per chart review, concerns already addressed

## 2017-07-22 ENCOUNTER — Other Ambulatory Visit: Payer: Self-pay | Admitting: Student

## 2017-07-22 ENCOUNTER — Ambulatory Visit (HOSPITAL_COMMUNITY)
Admission: RE | Admit: 2017-07-22 | Discharge: 2017-07-22 | Disposition: A | Discharge status: Home / Self Care | Payer: Medicaid Other | Source: Ambulatory Visit | Attending: Student | Admitting: Student

## 2017-07-22 DIAGNOSIS — D259 Leiomyoma of uterus, unspecified: Secondary | ICD-10-CM | POA: Insufficient documentation

## 2017-07-22 DIAGNOSIS — O368390 Maternal care for abnormalities of the fetal heart rate or rhythm, unspecified trimester, not applicable or unspecified: Secondary | ICD-10-CM | POA: Diagnosis present

## 2017-07-22 DIAGNOSIS — Z3A01 Less than 8 weeks gestation of pregnancy: Secondary | ICD-10-CM | POA: Diagnosis not present

## 2017-07-22 DIAGNOSIS — O3411 Maternal care for benign tumor of corpus uteri, first trimester: Secondary | ICD-10-CM | POA: Insufficient documentation

## 2017-08-17 ENCOUNTER — Telehealth: Payer: Self-pay | Admitting: *Deleted

## 2017-08-17 ENCOUNTER — Encounter: Payer: Self-pay | Admitting: *Deleted

## 2017-08-17 NOTE — Telephone Encounter (Signed)
Phone call to patient.  Patient had dropped off FMLA papers on 08/15/17.  Patient has not been seen in our office except for pregnancy test.  Explained to patient since she hasn't established prenatal care with our office, I couldn't complete her FMLA forms.  Patient states she is waiting for an appointment to start prenatal care at another office.  I explained that office would need to complete her paperwork.  I explained I could give her a pregnancy confirmation and restriction letter.  Patient is agreeable.  Encouraged patient to pick up today at her convenience prior to 1630.  Patient states understanding.

## 2017-08-29 LAB — OB RESULTS CONSOLE HEPATITIS B SURFACE ANTIGEN: HEP B S AG: NEGATIVE

## 2017-08-29 LAB — OB RESULTS CONSOLE GBS
GBS: POSITIVE
GBS: POSITIVE

## 2017-08-29 LAB — OB RESULTS CONSOLE RUBELLA ANTIBODY, IGM: RUBELLA: IMMUNE

## 2017-08-29 LAB — OB RESULTS CONSOLE ABO/RH

## 2017-08-29 LAB — OB RESULTS CONSOLE HIV ANTIBODY (ROUTINE TESTING): HIV: NONREACTIVE

## 2017-08-29 LAB — OB RESULTS CONSOLE ANTIBODY SCREEN: Antibody Screen: NEGATIVE

## 2017-08-29 LAB — OB RESULTS CONSOLE GC/CHLAMYDIA
CHLAMYDIA, DNA PROBE: NEGATIVE
GC PROBE AMP, GENITAL: NEGATIVE

## 2017-08-29 LAB — OB RESULTS CONSOLE RPR: RPR: NONREACTIVE

## 2017-09-11 ENCOUNTER — Encounter (HOSPITAL_COMMUNITY): Payer: Self-pay | Admitting: Emergency Medicine

## 2017-09-11 ENCOUNTER — Ambulatory Visit (HOSPITAL_COMMUNITY)
Admission: EM | Admit: 2017-09-11 | Discharge: 2017-09-11 | Disposition: A | Discharge status: Home / Self Care | Payer: Medicaid Other | Attending: Internal Medicine | Admitting: Internal Medicine

## 2017-09-11 DIAGNOSIS — H1032 Unspecified acute conjunctivitis, left eye: Secondary | ICD-10-CM | POA: Diagnosis not present

## 2017-09-11 MED ORDER — TETRACAINE HCL 0.5 % OP SOLN
OPHTHALMIC | Status: AC
Start: 2017-09-11 — End: 2017-09-11
  Filled 2017-09-11: qty 4

## 2017-09-11 MED ORDER — POLYMYXIN B-TRIMETHOPRIM 10000-0.1 UNIT/ML-% OP SOLN: 1.0000 [drp] | OPHTHALMIC | 0 refills | Status: AC

## 2017-09-11 MED ORDER — FLUORESCEIN SODIUM 0.6 MG OP STRP
ORAL_STRIP | OPHTHALMIC | Status: AC
  Filled 2017-09-11: qty 1

## 2017-09-11 MED ORDER — CETIRIZINE HCL 10 MG PO TABS: 10.0000 mg | ORAL_TABLET | Freq: Every day | ORAL | 0 refills | Status: DC

## 2017-09-11 MED ORDER — POLYETHYL GLYCOL-PROPYL GLYCOL 0.4-0.3 % OP GEL: 1.0000 "application " | Freq: Every evening | OPHTHALMIC | 0 refills | Status: DC | PRN

## 2017-09-11 NOTE — Discharge Instructions (Signed)
Use polytrim eyedrops as directed on left eye. Artificial tear gel at night. Wait 10-15 minutes between drops, always use artificial tear gel last, as it prevents drops from penetrating through. Lid scrubs and warm compresses as directed. Zyrtec for allergies. Monitor for any worsening of symptoms, changes in vision, sensitivity to light, eye swelling, follow up with ophthalmology for further evaluation.

## 2017-09-11 NOTE — ED Triage Notes (Signed)
Pt sts left eye pain and redness; pt sts some blurry vision x 4 days

## 2017-09-11 NOTE — ED Provider Notes (Signed)
[place]    CSN: [id number] Arrival date & time: 09/11/17  1756     History   Chief Complaint Chief Complaint  Patient presents with  . Eye Pain    HPI Diana Clark is a 26 y.o. female.   26 year old female who is roughly [redacted] week pregnant comes in for 4 day history of left eye itching and discomfort. Has also had redness and crusting first thing in the morning, with watery eyes throughout the day. Denies pain. Denies contact lens. Feels that eye is dry and is slightly sensitive to light. Vision changes due to watery eyes. No make up. Has seasonal allergies, does have watery eyes, itching, sneezing, itchy throat, rhinorrhea, nasal congestion. Denies cough, fever. Not taking anything due to allergies.       Past Medical History:  Diagnosis Date  . Medical history non-contributory   . SVD (spontaneous vaginal delivery) 08/28/2014    Patient Active Problem List   Diagnosis Date Noted  . SVD (spontaneous vaginal delivery) 08/28/2014  . Active labor at term 08/27/2014    Past Surgical History:  Procedure Laterality Date  . INCISE AND DRAIN ABCESS  2005   located on the right buttocks  . NO PAST SURGERIES      OB History    Gravida Para Term Preterm AB Living   3 1 1     1    SAB TAB Ectopic Multiple Live Births           1       Home Medications    Prior to Admission medications   Medication Sig Start Date End Date Taking? Authorizing Provider  cetirizine (ZYRTEC) 10 MG tablet Take 1 tablet (10 mg total) by mouth daily. 09/11/17   Tasia Catchings, Amy V, PA-C  Polyethyl Glycol-Propyl Glycol (SYSTANE) 0.4-0.3 % GEL ophthalmic gel Place 1 application into both eyes at bedtime as needed. 09/11/17   Tasia Catchings, Amy V, PA-C  trimethoprim-polymyxin b (POLYTRIM) ophthalmic solution Place 1 drop into the left eye every 4 (four) hours. 09/11/17 09/18/17  Ok Edwards, PA-C    Family History Family History  Problem Relation Age of Onset  . Hypertension Mother   . Hypertension  Paternal Grandmother     Social History Social History  Substance Use Topics  . Smoking status: Former Smoker    Types: Cigarettes  . Smokeless tobacco: Former Systems developer    Quit date: 01/05/2014  . Alcohol use Yes     Allergies   Patient has no known allergies.   Review of Systems Review of Systems  Reason unable to perform ROS: See HPI as above.     Physical Exam Triage Vital Signs ED Triage Vitals [09/11/17 1811]  Enc Vitals Group     BP 122/68     Pulse Rate 87     Resp 18     Temp 98.4 F (36.9 C)     Temp Source Oral     SpO2 100 %     Weight      Height      Head Circumference      Peak Flow      Pain Score 5     Pain Loc      Pain Edu?      Excl. in [place]?    No data found.   Updated Vital Signs BP 122/68 (BP Location: Right Arm)   Pulse 87   Temp 98.4 F (36.9 C) (Oral)   Resp  18   LMP 05/28/2017   SpO2 100%   Visual Acuity Right Eye Distance: 20/25 Left Eye Distance: 20/20 Bilateral Distance: 20/20  Right Eye Near:   Left Eye Near:    Bilateral Near:     Physical Exam  Constitutional: She is oriented to person, place, and time. She appears well-developed and well-nourished. No distress.  HENT:  Head: Normocephalic and atraumatic.  Eyes: Pupils are equal, round, and reactive to light. EOM and lids are normal. Lids are everted and swept, no foreign bodies found. Right conjunctiva is not injected. Left conjunctiva is injected.  Fluorescein stain without uptake.  Neurological: She is alert and oriented to person, place, and time.     UC Treatments / Results  Labs (all labs ordered are listed, but only abnormal results are displayed) Labs Reviewed - No data to display  EKG  EKG Interpretation None       Radiology No results found.  Procedures Procedures (including critical care time)  Medications Ordered in UC Medications - No data to display   Initial Impression / Assessment and Plan / UC Course  I have reviewed the triage  vital signs and the nursing notes.  Pertinent labs & imaging results that were available during my care of the patient were reviewed by me and considered in my medical decision making (see chart for details).    Start antibiotic drops as directed. Artificial tears gel as directed. Lid scrubs and warm compresses as directed. Zyrtec for allergies. Patient to follow up with ophthalmology if symptoms worsens or does not improve. Return precautions given.    Final Clinical Impressions(s) / UC Diagnoses   Final diagnoses:  Acute conjunctivitis of left eye, unspecified acute conjunctivitis type    New Prescriptions New Prescriptions   CETIRIZINE (ZYRTEC) 10 MG TABLET    Take 1 tablet (10 mg total) by mouth daily.   POLYETHYL GLYCOL-PROPYL GLYCOL (SYSTANE) 0.4-0.3 % GEL OPHTHALMIC GEL    Place 1 application into both eyes at bedtime as needed.   TRIMETHOPRIM-POLYMYXIN B (POLYTRIM) OPHTHALMIC SOLUTION    Place 1 drop into the left eye every 4 (four) hours.      Ok Edwards, PA-C 09/11/17 1919

## 2017-11-15 NOTE — L&D Delivery Note (Signed)
Delivery Note Pt pushed very well for 10-52minutes for delivery.  At 1:38 AM a viable and healthy female was delivered via Vaginal, Spontaneous (Presentation: OP; LOT  ).  APGAR: 8, 9; weight  P.   Placenta status: delivered, intact .  Cord: 3V with the following complications: nuchal x 1.    Anesthesia:  epidural Episiotomy: None Lacerations: None Suture Repair: N/A Est. Blood Loss (mL): 100cc  Mom to postpartum.  Baby to Couplet care / Skin to Skin.  Diana Clark 03/01/2018, 1:59 AM  Br/B+/RI/Tdap in PNC/Contra?

## 2018-01-22 ENCOUNTER — Encounter (HOSPITAL_COMMUNITY): Payer: Self-pay | Admitting: *Deleted

## 2018-01-22 ENCOUNTER — Inpatient Hospital Stay (HOSPITAL_COMMUNITY)
Admission: AD | Admit: 2018-01-22 | Discharge: 2018-01-22 | Disposition: A | Discharge status: Home / Self Care | Payer: Medicaid Other | Source: Ambulatory Visit | Attending: Obstetrics and Gynecology | Admitting: Obstetrics and Gynecology

## 2018-01-22 DIAGNOSIS — Z3A34 34 weeks gestation of pregnancy: Secondary | ICD-10-CM | POA: Insufficient documentation

## 2018-01-22 DIAGNOSIS — O26893 Other specified pregnancy related conditions, third trimester: Secondary | ICD-10-CM

## 2018-01-22 DIAGNOSIS — N898 Other specified noninflammatory disorders of vagina: Secondary | ICD-10-CM | POA: Diagnosis not present

## 2018-01-22 DIAGNOSIS — Z3483 Encounter for supervision of other normal pregnancy, third trimester: Secondary | ICD-10-CM | POA: Insufficient documentation

## 2018-01-22 DIAGNOSIS — Z0371 Encounter for suspected problem with amniotic cavity and membrane ruled out: Secondary | ICD-10-CM

## 2018-01-22 LAB — POCT FERN TEST: POCT FERN TEST: NEGATIVE

## 2018-01-22 LAB — AMNISURE RUPTURE OF MEMBRANE (ROM) NOT AT ARMC: Amnisure ROM: NEGATIVE

## 2018-01-22 NOTE — MAU Note (Signed)
Urine sent to lab 

## 2018-01-22 NOTE — MAU Note (Signed)
Water broke at [phone]

## 2018-01-22 NOTE — Discharge Instructions (Signed)

## 2018-01-22 NOTE — MAU Provider Note (Signed)
S: Ms. Diana Clark is a 27 y.o. G2P1001 at [redacted]w[redacted]d  who presents to MAU today complaining of leaking of fluid since 0300. She denies vaginal bleeding. She denies contractions. She reports normal fetal movement.    O: BP 106/61   Pulse 96   Temp 98.1 F (36.7 C) (Oral)   Resp 18   Ht 5\' 4"  (1.626 m)   Wt 194 lb (88 kg)   LMP 05/28/2017   SpO2 98%   BMI 33.30 kg/m  GENERAL: Well-developed, well-nourished female in no acute distress.  HEAD: Normocephalic, atraumatic.  CHEST: Normal effort of breathing, regular heart rate ABDOMEN: Soft, nontender, gravid   Cervical exam: deferred   Fetal Monitoring: Baseline: 145  Variability: Moderate Accelerations: 15x15 Decelerations: none Contractions: None  Results for orders placed or performed during the hospital encounter of 01/22/18 (from the past 24 hour(s))  Fern Test     Status: Normal   Collection Time: 01/22/18  4:49 AM  Result Value Ref Range   POCT Fern Test Negative = intact amniotic membranes   Amnisure rupture of membrane (rom)not at [facility]     Status: None   Collection Time: 01/22/18  4:55 AM  Result Value Ref Range   Amnisure ROM NEGATIVE      A: SIUP at [redacted]w[redacted]d  Membranes intact  P:  Discharge home in stable condition  Discussed results with Dr. Terri Piedra, ok for DC home Return to MAU if symptoms worsen  Preterm labor precautions  Follow up in the office as scheduled   Deette Revak, Anderson [place] I, NP 01/22/2018 5:35 AM

## 2018-02-05 LAB — OB RESULTS CONSOLE GBS
GBS: NEGATIVE
STREP GROUP B AG: NEGATIVE
STREP GROUP B AG: POSITIVE

## 2018-02-10 LAB — OB RESULTS CONSOLE GBS: STREP GROUP B AG: POSITIVE

## 2018-02-22 ENCOUNTER — Encounter (HOSPITAL_COMMUNITY): Payer: Self-pay | Admitting: *Deleted

## 2018-02-22 ENCOUNTER — Telehealth (HOSPITAL_COMMUNITY): Payer: Self-pay | Admitting: *Deleted

## 2018-02-22 NOTE — Telephone Encounter (Signed)
Preadmission screen  

## 2018-02-23 NOTE — Telephone Encounter (Signed)
Preadmission screen  

## 2018-02-25 ENCOUNTER — Encounter (HOSPITAL_COMMUNITY): Payer: Self-pay | Admitting: *Deleted

## 2018-02-25 ENCOUNTER — Inpatient Hospital Stay (HOSPITAL_COMMUNITY)
Admission: AD | Admit: 2018-02-25 | Discharge: 2018-02-25 | Disposition: A | Discharge status: Home / Self Care | Payer: Medicaid Other | Source: Ambulatory Visit | Attending: Obstetrics and Gynecology | Admitting: Obstetrics and Gynecology

## 2018-02-25 ENCOUNTER — Other Ambulatory Visit: Payer: Self-pay

## 2018-02-25 DIAGNOSIS — Z3490 Encounter for supervision of normal pregnancy, unspecified, unspecified trimester: Secondary | ICD-10-CM | POA: Insufficient documentation

## 2018-02-25 DIAGNOSIS — Z3A00 Weeks of gestation of pregnancy not specified: Secondary | ICD-10-CM | POA: Insufficient documentation

## 2018-02-25 DIAGNOSIS — O471 False labor at or after 37 completed weeks of gestation: Secondary | ICD-10-CM

## 2018-02-25 LAB — URINALYSIS, ROUTINE W REFLEX MICROSCOPIC
Bilirubin Urine: NEGATIVE
Glucose, UA: NEGATIVE mg/dL
Hgb urine dipstick: NEGATIVE
KETONES UR: NEGATIVE mg/dL
LEUKOCYTES UA: NEGATIVE
Nitrite: NEGATIVE
PH: 6 (ref 5.0–8.0)
Protein, ur: NEGATIVE mg/dL
SPECIFIC GRAVITY, URINE: 1.002 — AB (ref 1.005–1.030)

## 2018-02-25 NOTE — MAU Note (Signed)
Pt reports sharp cramping upper abdominal pain that started after she had drank a pepsi this evening. Now she has constant abdominal pressure especially when walking. Denies intercourse in the last 24. BM today. Denies pain with urination. Denies LOF or Vaginal Bleeding. + FM

## 2018-02-25 NOTE — Discharge Instructions (Signed)
Braxton Hicks Contractions °Contractions of the uterus can occur throughout pregnancy, but they are not always a sign that you are in labor. You may have practice contractions called Braxton Hicks contractions. These false labor contractions are sometimes confused with true labor. °What are Braxton Hicks contractions? °Braxton Hicks contractions are tightening movements that occur in the muscles of the uterus before labor. Unlike true labor contractions, these contractions do not result in opening (dilation) and thinning of the cervix. Toward the end of pregnancy (32-34 weeks), Braxton Hicks contractions can happen more often and may become stronger. These contractions are sometimes difficult to tell apart from true labor because they can be very uncomfortable. You should not feel embarrassed if you go to the hospital with false labor. °Sometimes, the only way to tell if you are in true labor is for your health care provider to look for changes in the cervix. The health care provider will do a physical exam and may monitor your contractions. If you are not in true labor, the exam should show that your cervix is not dilating and your water has not broken. °If there are other health problems associated with your pregnancy, it is completely safe for you to be sent home with false labor. You may continue to have Braxton Hicks contractions until you go into true labor. °How to tell the difference between true labor and false labor °True labor °· Contractions last 30-70 seconds. °· Contractions become very regular. °· Discomfort is usually felt in the top of the uterus, and it spreads to the lower abdomen and low back. °· Contractions do not go away with walking. °· Contractions usually become more intense and increase in frequency. °· The cervix dilates and gets thinner. °False labor °· Contractions are usually shorter and not as strong as true labor contractions. °· Contractions are usually irregular. °· Contractions  are often felt in the front of the lower abdomen and in the groin. °· Contractions may go away when you walk around or change positions while lying down. °· Contractions get weaker and are shorter-lasting as time goes on. °· The cervix usually does not dilate or become thin. °Follow these instructions at home: °· Take over-the-counter and prescription medicines only as told by your health care provider. °· Keep up with your usual exercises and follow other instructions from your health care provider. °· Eat and drink lightly if you think you are going into labor. °· If Braxton Hicks contractions are making you uncomfortable: °? Change your position from lying down or resting to walking, or change from walking to resting. °? Sit and rest in a tub of warm water. °? Drink enough fluid to keep your urine pale yellow. Dehydration may cause these contractions. °? Do slow and deep breathing several times an hour. °· Keep all follow-up prenatal visits as told by your health care provider. This is important. °Contact a health care provider if: °· You have a fever. °· You have continuous pain in your abdomen. °Get help right away if: °· Your contractions become stronger, more regular, and closer together. °· You have fluid leaking or gushing from your vagina. °· You pass blood-tinged mucus (bloody show). °· You have bleeding from your vagina. °· You have low back pain that you never had before. °· You feel your baby’s head pushing down and causing pelvic pressure. °· Your baby is not moving inside you as much as it used to. °Summary °· Contractions that occur before labor are called Braxton   Hicks contractions, false labor, or practice contractions. °· Braxton Hicks contractions are usually shorter, weaker, farther apart, and less regular than true labor contractions. True labor contractions usually become progressively stronger and regular and they become more frequent. °· Manage discomfort from Braxton Hicks contractions by  changing position, resting in a warm bath, drinking plenty of water, or practicing deep breathing. °This information is not intended to replace advice given to you by your health care provider. Make sure you discuss any questions you have with your health care provider. °Document Released: 03/17/2017 Document Revised: 03/17/2017 Document Reviewed: 03/17/2017 °Elsevier Interactive Patient Education © 2018 [employer] ° °

## 2018-02-25 NOTE — MAU Note (Signed)
I have communicated with Dr. Willis Modena and reviewed vital signs:  Vitals:   02/25/18 2149 02/25/18 2219  BP: (!) 103/53   Pulse: (!) 119 97  Resp: 18   Temp: 98.6 F (37 C)   SpO2: 98%     Vaginal exam:  Dilation: 1 Exam by:: C Neill CNM,   Also reviewed contraction pattern and that non-stress test is reactive.  It has been documented that patient is contracting every 3-8 minutes with no cervical change since her last appointment not indicating active labor.  Patient denies any other complaints.  Based on this report provider has given order for discharge.  A discharge order and diagnosis entered by a provider.   Labor discharge instructions reviewed with patient.

## 2018-02-27 NOTE — H&P (Signed)
Diana Clark is a 27 y.o. female G2P1001 at 39+ for IOL given term status.  D/W pt r/b/a of IOL, also process.  Pt is GBBS + - will use cytotec first, ROM after 4hr of PCN.  reletively uncomplicated PNC.  Dated by LMP cw first trimester [place].    OB History    Gravida  2   Para  1   Term  1   Preterm      AB      Living  1     SAB      TAB      Ectopic      Multiple      Live Births  1         G1 10/15 8#5 female, SVD No abn pap, last 2018 H/o GC, Chl, trich  Past Medical History:  Diagnosis Date  . Hx of chlamydia infection   . Hx of gonorrhea   . Medical history non-contributory   . SVD (spontaneous vaginal delivery) 08/28/2014   Past Surgical History:  Procedure Laterality Date  . INCISE AND DRAIN ABCESS  2005   located on the right buttocks  . NO PAST SURGERIES     Family History: family history includes Hypertension in her mother and paternal grandmother. FOB FH of Down's syndrome and autism Social History:  reports that she has quit smoking. Her smoking use included cigarettes. She quit smokeless tobacco use about 4 years ago. She reports that she drinks alcohol. She reports that she does not use drugs. single, customer service.    Meds PNV All NKDA     Maternal Diabetes: No Genetic Screening: Normal Maternal Ultrasounds/Referrals: Normal Fetal Ultrasounds or other Referrals:  None Maternal Substance Abuse:  No Significant Maternal Medications:  None Significant Maternal Lab Results:  Lab values include: Group B Strep positive Other Comments:  None  Review of Systems  Constitutional: Negative.   HENT: Negative.   Eyes: Negative.   Respiratory: Negative.   Cardiovascular: Negative.   Gastrointestinal: Negative.   Genitourinary: Negative.   Musculoskeletal: Positive for back pain.  Skin: Negative.   Neurological: Negative.   Psychiatric/Behavioral: Negative.    Maternal Medical History:  Contractions: Frequency: irregular.   Perceived  severity is mild.    Fetal activity: Perceived fetal activity is normal.    Prenatal Complications - Diabetes: none.      Last menstrual period 05/28/2017, unknown if currently breastfeeding. Maternal Exam:  Uterine Assessment: Contraction frequency is irregular.   Abdomen: Patient reports no abdominal tenderness. Fundal height is app for gestation.   Estimated fetal weight is 8-9#.   Fetal presentation: vertex  Introitus: Normal vulva. Normal vagina.    Physical Exam  Constitutional: She is oriented to person, place, and time. She appears well-developed and well-nourished.  HENT:  Head: Normocephalic and atraumatic.  Cardiovascular: Normal rate and regular rhythm.  Respiratory: Effort normal and breath sounds normal. No respiratory distress. She has no wheezes.  GI: Soft. Bowel sounds are normal. She exhibits no distension. There is no tenderness.  Musculoskeletal: Normal range of motion.  Neurological: She is alert and oriented to person, place, and time.  Skin: Skin is warm and dry.  Psychiatric: She has a normal mood and affect. Her behavior is normal.    Prenatal labs: ABO, Rh:  B+ Antibody:  neg Rubella:  immune RPR:   NR HBsAg:   neg HIV:   neg GBS:   positive  Hgb 12.6/Plt 304/UrCx neg/Chl neg/GC neg/Varicella immune/Hgb  electro WNL/Pap WNL//AFP WNL/glucola 87/  First tri [place] cwd Ant plac, limited anat, female F.u [place] nl anat   Assessment/Plan: 26yo G2P1001 at 39+ for IOL Cytotec for IOL, then AROM/pitocin GBBS PCN for prophylacis Expect SVD    Dent Plantz Bovard-Stuckert 02/27/2018, 9:50 PM

## 2018-02-28 ENCOUNTER — Inpatient Hospital Stay (HOSPITAL_COMMUNITY)
Admission: RE | Admit: 2018-02-28 | Discharge: 2018-03-02 | DRG: 807 | Disposition: A | Discharge status: Home / Self Care | Payer: Medicaid Other | Source: Ambulatory Visit | Attending: Obstetrics and Gynecology | Admitting: Obstetrics and Gynecology

## 2018-02-28 ENCOUNTER — Inpatient Hospital Stay (HOSPITAL_COMMUNITY): Payer: Medicaid Other | Admitting: Anesthesiology

## 2018-02-28 ENCOUNTER — Encounter (HOSPITAL_COMMUNITY): Payer: Self-pay

## 2018-02-28 DIAGNOSIS — O99214 Obesity complicating childbirth: Secondary | ICD-10-CM | POA: Diagnosis present

## 2018-02-28 DIAGNOSIS — Z87891 Personal history of nicotine dependence: Secondary | ICD-10-CM | POA: Diagnosis not present

## 2018-02-28 DIAGNOSIS — Z3A39 39 weeks gestation of pregnancy: Secondary | ICD-10-CM | POA: Diagnosis not present

## 2018-02-28 DIAGNOSIS — O99824 Streptococcus B carrier state complicating childbirth: Secondary | ICD-10-CM | POA: Diagnosis present

## 2018-02-28 DIAGNOSIS — Z3483 Encounter for supervision of other normal pregnancy, third trimester: Secondary | ICD-10-CM

## 2018-02-28 DIAGNOSIS — O26893 Other specified pregnancy related conditions, third trimester: Secondary | ICD-10-CM | POA: Diagnosis present

## 2018-02-28 LAB — TYPE AND SCREEN
ABO/RH(D): B POS
Antibody Screen: NEGATIVE

## 2018-02-28 LAB — CBC
HCT: 31.1 % — ABNORMAL LOW (ref 36.0–46.0)
Hemoglobin: 10.3 g/dL — ABNORMAL LOW (ref 12.0–15.0)
MCH: 29 pg (ref 26.0–34.0)
MCHC: 33.1 g/dL (ref 30.0–36.0)
MCV: 87.6 fL (ref 78.0–100.0)
PLATELETS: 232 10*3/uL (ref 150–400)
RBC: 3.55 MIL/uL — ABNORMAL LOW (ref 3.87–5.11)
RDW: 14.4 % (ref 11.5–15.5)
WBC: 9.1 10*3/uL (ref 4.0–10.5)

## 2018-02-28 LAB — RPR: RPR Ser Ql: NONREACTIVE

## 2018-02-28 MED ORDER — ACETAMINOPHEN 325 MG PO TABS: 650.0000 mg | ORAL_TABLET | ORAL | Status: DC | PRN

## 2018-02-28 MED ORDER — EPHEDRINE 5 MG/ML INJ: 10.0000 mg | INTRAVENOUS | Status: DC | PRN

## 2018-02-28 MED ORDER — LACTATED RINGERS IV SOLN
INTRAVENOUS | Status: DC
  Administered 2018-02-28: 08:00:00 via INTRAVENOUS

## 2018-02-28 MED ORDER — SALINE SPRAY 0.65 % NA SOLN
1.0000 | NASAL | Status: DC | PRN
  Administered 2018-02-28: 1 via NASAL
  Filled 2018-02-28: qty 44

## 2018-02-28 MED ORDER — EPHEDRINE 5 MG/ML INJ
10.0000 mg | INTRAVENOUS | Status: DC | PRN
  Filled 2018-02-28: qty 2

## 2018-02-28 MED ORDER — PHENYLEPHRINE 40 MCG/ML (10ML) SYRINGE FOR IV PUSH (FOR BLOOD PRESSURE SUPPORT)
PREFILLED_SYRINGE | INTRAVENOUS | Status: AC
  Filled 2018-02-28: qty 20

## 2018-02-28 MED ORDER — FENTANYL 2.5 MCG/ML BUPIVACAINE 1/10 % EPIDURAL INFUSION (WH - ANES)
INTRAMUSCULAR | Status: AC
  Filled 2018-02-28: qty 100

## 2018-02-28 MED ORDER — DIPHENHYDRAMINE HCL 50 MG/ML IJ SOLN: 12.5000 mg | INTRAMUSCULAR | Status: DC | PRN

## 2018-02-28 MED ORDER — OXYCODONE-ACETAMINOPHEN 5-325 MG PO TABS: 1.0000 | ORAL_TABLET | ORAL | Status: DC | PRN

## 2018-02-28 MED ORDER — OXYTOCIN BOLUS FROM INFUSION
500.0000 mL | Freq: Once | INTRAVENOUS | Status: AC
  Administered 2018-03-01: 500 mL via INTRAVENOUS

## 2018-02-28 MED ORDER — OXYCODONE-ACETAMINOPHEN 5-325 MG PO TABS: 2.0000 | ORAL_TABLET | ORAL | Status: DC | PRN

## 2018-02-28 MED ORDER — LORATADINE 10 MG PO TABS
10.0000 mg | ORAL_TABLET | Freq: Every day | ORAL | Status: DC
  Administered 2018-02-28 – 2018-03-02 (×3): 10 mg via ORAL
  Filled 2018-02-28 (×4): qty 1

## 2018-02-28 MED ORDER — OXYTOCIN 40 UNITS IN LACTATED RINGERS INFUSION - SIMPLE MED: 2.5000 [IU]/h | INTRAVENOUS | Status: DC

## 2018-02-28 MED ORDER — LIDOCAINE HCL (PF) 1 % IJ SOLN
INTRAMUSCULAR | Status: DC | PRN
  Administered 2018-02-28 (×2): 5 mL

## 2018-02-28 MED ORDER — SOD CITRATE-CITRIC ACID 500-334 MG/5ML PO SOLN: 30.0000 mL | ORAL | Status: DC | PRN

## 2018-02-28 MED ORDER — LIDOCAINE HCL (PF) 1 % IJ SOLN
30.0000 mL | INTRAMUSCULAR | Status: DC | PRN
  Filled 2018-02-28: qty 30

## 2018-02-28 MED ORDER — SODIUM CHLORIDE 0.9 % IV SOLN
5.0000 10*6.[IU] | Freq: Once | INTRAVENOUS | Status: AC
  Administered 2018-02-28: 5 10*6.[IU] via INTRAVENOUS
  Filled 2018-02-28: qty 5

## 2018-02-28 MED ORDER — MISOPROSTOL 25 MCG QUARTER TABLET
25.0000 ug | ORAL_TABLET | ORAL | Status: DC | PRN
  Administered 2018-02-28 (×2): 25 ug via VAGINAL
  Filled 2018-02-28 (×3): qty 1

## 2018-02-28 MED ORDER — LACTATED RINGERS IV SOLN: 500.0000 mL | Freq: Once | INTRAVENOUS | Status: DC

## 2018-02-28 MED ORDER — PHENYLEPHRINE 40 MCG/ML (10ML) SYRINGE FOR IV PUSH (FOR BLOOD PRESSURE SUPPORT)
80.0000 ug | PREFILLED_SYRINGE | INTRAVENOUS | Status: DC | PRN
  Filled 2018-02-28: qty 5

## 2018-02-28 MED ORDER — PHENYLEPHRINE 40 MCG/ML (10ML) SYRINGE FOR IV PUSH (FOR BLOOD PRESSURE SUPPORT): 80.0000 ug | PREFILLED_SYRINGE | INTRAVENOUS | Status: DC | PRN

## 2018-02-28 MED ORDER — PENICILLIN G POT IN DEXTROSE 60000 UNIT/ML IV SOLN
3.0000 10*6.[IU] | INTRAVENOUS | Status: DC
  Administered 2018-02-28 – 2018-03-01 (×4): 3 10*6.[IU] via INTRAVENOUS
  Filled 2018-02-28 (×7): qty 50

## 2018-02-28 MED ORDER — FENTANYL 2.5 MCG/ML BUPIVACAINE 1/10 % EPIDURAL INFUSION (WH - ANES)
14.0000 mL/h | INTRAMUSCULAR | Status: DC | PRN
  Administered 2018-02-28: 12 mL/h via EPIDURAL

## 2018-02-28 MED ORDER — OXYTOCIN 40 UNITS IN LACTATED RINGERS INFUSION - SIMPLE MED
1.0000 m[IU]/min | INTRAVENOUS | Status: DC
  Administered 2018-02-28: 2 m[IU]/min via INTRAVENOUS
  Filled 2018-02-28: qty 1000

## 2018-02-28 MED ORDER — BUTORPHANOL TARTRATE 1 MG/ML IJ SOLN
1.0000 mg | INTRAMUSCULAR | Status: DC | PRN
  Administered 2018-02-28: 1 mg via INTRAVENOUS
  Filled 2018-02-28: qty 1

## 2018-02-28 MED ORDER — TERBUTALINE SULFATE 1 MG/ML IJ SOLN
0.2500 mg | Freq: Once | INTRAMUSCULAR | Status: DC | PRN
  Filled 2018-02-28: qty 1

## 2018-02-28 MED ORDER — ONDANSETRON HCL 4 MG/2ML IJ SOLN: 4.0000 mg | Freq: Four times a day (QID) | INTRAMUSCULAR | Status: DC | PRN

## 2018-02-28 MED ORDER — LACTATED RINGERS IV SOLN: 500.0000 mL | INTRAVENOUS | Status: DC | PRN

## 2018-02-28 NOTE — Progress Notes (Signed)
Patient ID: Diana Clark, female   DOB: 30-Apr-1991, 27 y.o.   MRN: [id number]   Late entry  Pt somewhat uncomfortable  AFVSS gen NAD FHTs 150's min/mod var, category 2 toco q 2-81min  SVE FT/0/-2  cytotec placed  Cont close monitoring

## 2018-02-28 NOTE — Progress Notes (Signed)
Patient ID: Diana Clark, female   DOB: January 13, 1991, 27 y.o.   MRN: [id number]  Pt feeling some ctx.  Managing well.  AFVSS gen NAD FHTs 150-155, mod var, + accels, category 1 toco Q 33min  SVE 2/50/-2  AROM for clear fluid, w/o diff/comp  Will start pitocin 2 by 2  Continue close monitorin Expect SVD

## 2018-02-28 NOTE — Progress Notes (Signed)
Patient ID: Diana Clark, female   DOB: 1991/07/11, 27 y.o.   MRN: [id number]   Pt very uncomfortable w contractions.  D/W pt labor as long process not to fight ctx.  D/W pt induction is different than natural labor.  Also Stadol does not continue to work well.  D/W pt pain relief options  AFVSS gen NAD FHTs 150's, mod var, + accels, category 1 toco q 44min  SVE 2.3/50/-2  IUPC placed w/o diff/comp Expect SVD

## 2018-02-28 NOTE — Anesthesia Pain Management Evaluation Note (Signed)
  CRNA Pain Management Visit Note  Patient: Diana Clark, 27 y.o., female  "Hello I am a member of the anesthesia team at [facility]. We have an anesthesia team available at all times to provide care throughout the hospital, including epidural management and anesthesia for C-section. I don't know your plan for the delivery whether it a natural birth, water birth, IV sedation, nitrous supplementation, doula or epidural, but we want to meet your pain goals."   1.Was your pain managed to your expectations on prior hospitalizations?   Yes   2.What is your expectation for pain management during this hospitalization?     Labor support without medications and Epidural  3.How can we help you reach that goal? Be available as pt decides  Record the patient's initial score and the patient's pain goal.   Pain: 5  Pain Goal: 10 [facility] wants you to be able to say your pain was always managed very well.  [name] 02/28/2018

## 2018-02-28 NOTE — Progress Notes (Signed)
Patient ID: Diana Clark, female   DOB: [birth year], 27 y.o.   MRN: [id number]   Reviewed H&P, no changes.  D/w pt POC for IOL  AF VSS gen NAD FHTs 150-160, mod var, +accels, category 1 toco irr  SVE ext os 1.2/int os cl/20/-3; vtx  Continue IOL PCN for GBBS Expect SVD

## 2018-02-28 NOTE — Anesthesia Procedure Notes (Signed)
Epidural Patient location during procedure: OB Start time: 02/28/2018 9:45 PM End time: 02/28/2018 10:06 PM  Staffing Anesthesiologist: Annye Asa, MD Performed: anesthesiologist   Preanesthetic Checklist Completed: patient identified, surgical consent, pre-op evaluation, timeout performed, IV checked, risks and benefits discussed and monitors and equipment checked  Epidural Patient position: sitting Prep: site prepped and draped and DuraPrep Patient monitoring: blood pressure, continuous pulse ox and heart rate Approach: midline Location: L3-L4 Injection technique: LOR air  Needle:  Needle type: Tuohy  Needle gauge: 17 G Needle length: 9 cm Needle insertion depth: 5 cm Catheter type: closed end flexible Catheter size: 19 Gauge Catheter at skin depth: 11 cm Test dose: negative (1% lidocaine)  Assessment Events: blood not aspirated, injection not painful, no injection resistance, negative IV test and no paresthesia  Additional Notes Pt identified in Labor room.  Monitors applied. Working IV access confirmed. Sterile prep, drape lumbar spine.  1% lido local L 3,4.  #17ga Touhy LOR air at 5 cm L 3,4, cath in easily to 11 cm skin. Test dose OK, cath dosed and infusion begun.  Patient asymptomatic, VSS, no heme aspirated, tolerated well.  Jenita Seashore, MDReason for block:procedure for pain

## 2018-02-28 NOTE — Anesthesia Preprocedure Evaluation (Addendum)
Anesthesia Evaluation  Patient identified by MRN, date of birth, ID band Patient awake    Reviewed: Allergy & Precautions, NPO status , Patient's Chart, lab work & pertinent test results  History of Anesthesia Complications Negative for: history of anesthetic complications  Airway Mallampati: IV  TM Distance: >3 FB Neck ROM: Full    Dental  (+) Dental Advisory Given   Pulmonary former smoker (quit 2015),    breath sounds clear to auscultation       Cardiovascular negative cardio ROS   Rhythm:Regular Rate:Normal     Neuro/Psych negative neurological ROS     GI/Hepatic Neg liver ROS, GERD  ,  Endo/Other  Morbid obesity  Renal/GU negative Renal ROS     Musculoskeletal   Abdominal (+) + obese,   Peds  Hematology Hb 10.3, plt 232k   Anesthesia Other Findings   Reproductive/Obstetrics (+) Pregnancy                            Anesthesia Physical Anesthesia Plan  ASA: II  Anesthesia Plan: Epidural   Post-op Pain Management:    Induction:   PONV Risk Score and Plan: Treatment may vary due to age or medical condition  Airway Management Planned: Natural Airway  Additional Equipment:   Intra-op Plan:   Post-operative Plan:   Informed Consent: I have reviewed the patients History and Physical, chart, labs and discussed the procedure including the risks, benefits and alternatives for the proposed anesthesia with the patient or authorized representative who has indicated his/her understanding and acceptance.   Dental advisory given  Plan Discussed with:   Anesthesia Plan Comments: (Patient identified. Risks/Benefits/Options discussed with patient including but not limited to bleeding, infection, nerve damage, paralysis, failed block, incomplete pain control, headache, blood pressure changes, nausea, vomiting, reactions to medication both or allergic, itching and postpartum back pain.  Confirmed with bedside nurse the patient's most recent platelet count. Confirmed with patient that they are not currently taking any anticoagulation, have any bleeding history or any family history of bleeding disorders. Patient expressed understanding and wished to proceed. All questions were answered. )       Anesthesia Quick Evaluation

## 2018-03-01 ENCOUNTER — Other Ambulatory Visit: Payer: Self-pay

## 2018-03-01 ENCOUNTER — Encounter (HOSPITAL_COMMUNITY): Payer: Self-pay

## 2018-03-01 MED ORDER — TETANUS-DIPHTH-ACELL PERTUSSIS 5-2.5-18.5 LF-MCG/0.5 IM SUSP: 0.5000 mL | Freq: Once | INTRAMUSCULAR | Status: DC

## 2018-03-01 MED ORDER — COCONUT OIL OIL: 1.0000 "application " | TOPICAL_OIL | Status: DC | PRN

## 2018-03-01 MED ORDER — LACTATED RINGERS IV SOLN: INTRAVENOUS | Status: DC

## 2018-03-01 MED ORDER — OXYCODONE HCL 5 MG PO TABS: 5.0000 mg | ORAL_TABLET | ORAL | Status: DC | PRN

## 2018-03-01 MED ORDER — PRENATAL MULTIVITAMIN CH
1.0000 | ORAL_TABLET | Freq: Every day | ORAL | Status: DC
  Administered 2018-03-01 – 2018-03-02 (×2): 1 via ORAL
  Filled 2018-03-01 (×2): qty 1

## 2018-03-01 MED ORDER — ONDANSETRON HCL 4 MG/2ML IJ SOLN: 4.0000 mg | INTRAMUSCULAR | Status: DC | PRN

## 2018-03-01 MED ORDER — BENZOCAINE-MENTHOL 20-0.5 % EX AERO: 1.0000 "application " | INHALATION_SPRAY | CUTANEOUS | Status: DC | PRN

## 2018-03-01 MED ORDER — ZOLPIDEM TARTRATE 5 MG PO TABS: 5.0000 mg | ORAL_TABLET | Freq: Every evening | ORAL | Status: DC | PRN

## 2018-03-01 MED ORDER — WITCH HAZEL-GLYCERIN EX PADS: 1.0000 "application " | MEDICATED_PAD | CUTANEOUS | Status: DC | PRN

## 2018-03-01 MED ORDER — DIBUCAINE 1 % RE OINT: 1.0000 "application " | TOPICAL_OINTMENT | RECTAL | Status: DC | PRN

## 2018-03-01 MED ORDER — ONDANSETRON HCL 4 MG PO TABS: 4.0000 mg | ORAL_TABLET | ORAL | Status: DC | PRN

## 2018-03-01 MED ORDER — OXYCODONE HCL 5 MG PO TABS: 10.0000 mg | ORAL_TABLET | ORAL | Status: DC | PRN

## 2018-03-01 MED ORDER — DIPHENHYDRAMINE HCL 25 MG PO CAPS: 25.0000 mg | ORAL_CAPSULE | Freq: Four times a day (QID) | ORAL | Status: DC | PRN

## 2018-03-01 MED ORDER — SENNOSIDES-DOCUSATE SODIUM 8.6-50 MG PO TABS
2.0000 | ORAL_TABLET | ORAL | Status: DC
  Administered 2018-03-01: 2 via ORAL
  Filled 2018-03-01: qty 2

## 2018-03-01 MED ORDER — SIMETHICONE 80 MG PO CHEW: 80.0000 mg | CHEWABLE_TABLET | ORAL | Status: DC | PRN

## 2018-03-01 MED ORDER — ACETAMINOPHEN 325 MG PO TABS
650.0000 mg | ORAL_TABLET | ORAL | Status: DC | PRN
  Administered 2018-03-01 – 2018-03-02 (×3): 650 mg via ORAL
  Filled 2018-03-01 (×3): qty 2

## 2018-03-01 MED ORDER — IBUPROFEN 600 MG PO TABS
600.0000 mg | ORAL_TABLET | Freq: Four times a day (QID) | ORAL | Status: DC
  Administered 2018-03-01 – 2018-03-02 (×6): 600 mg via ORAL
  Filled 2018-03-01 (×6): qty 1

## 2018-03-01 NOTE — Anesthesia Postprocedure Evaluation (Signed)
Anesthesia Post Note  Patient: Diana Clark  Procedure(s) Performed: AN AD HOC LABOR EPIDURAL     Patient location during evaluation: Mother Baby Anesthesia Type: Epidural Level of consciousness: awake Pain management: pain level controlled Vital Signs Assessment: post-procedure vital signs reviewed and stable Respiratory status: spontaneous breathing Cardiovascular status: stable Postop Assessment: epidural receding, patient able to bend at knees and no headache Anesthetic complications: no    Last Vitals:  Vitals:   03/01/18 0330 03/01/18 0430  BP: 116/75 121/80  Pulse: 95 (!) 102  Resp: 18 18  Temp: 36.8 C   SpO2: 100% 99%    Last Pain:  Vitals:   03/01/18 0445  TempSrc:   PainSc: 1    Pain Goal:                 [name]

## 2018-03-01 NOTE — Progress Notes (Signed)
Post Partum Day 0 Subjective: no complaints and tolerating PO  Objective: Blood pressure 109/77, pulse 88, temperature 98.7 F (37.1 C), temperature source Axillary, resp. rate 18, height 5\' 4"  (1.626 m), weight 195 lb (88.5 kg), last menstrual period 05/28/2017, SpO2 100 %, unknown if currently breastfeeding.  Physical Exam:  General: alert and cooperative Lochia: appropriate Uterine Fundus: firm   Recent Labs    02/28/18 0733  HGB 10.3*  HCT 31.1*    Assessment/Plan: Plan for discharge tomorrow if baby able to go   LOS: 1 day   Diana Clark 03/01/2018, 9:51 AM

## 2018-03-02 LAB — CBC
HEMATOCRIT: 30.9 % — AB (ref 36.0–46.0)
Hemoglobin: 10.4 g/dL — ABNORMAL LOW (ref 12.0–15.0)
MCH: 29.5 pg (ref 26.0–34.0)
MCHC: 33.7 g/dL (ref 30.0–36.0)
MCV: 87.8 fL (ref 78.0–100.0)
PLATELETS: 209 10*3/uL (ref 150–400)
RBC: 3.52 MIL/uL — ABNORMAL LOW (ref 3.87–5.11)
RDW: 14.8 % (ref 11.5–15.5)
WBC: 13 10*3/uL — AB (ref 4.0–10.5)

## 2018-03-02 LAB — BIRTH TISSUE RECOVERY COLLECTION (PLACENTA DONATION)

## 2018-03-02 MED ORDER — IBUPROFEN 600 MG PO TABS: 600.0000 mg | ORAL_TABLET | Freq: Four times a day (QID) | ORAL | 0 refills | Status: DC

## 2018-03-02 NOTE — Progress Notes (Signed)
PPD #1 No problems, wants to go home Afeb, VSS Fundus firm, NT at U-1 Continue routine postpartum care, d/c home

## 2018-03-02 NOTE — Discharge Summary (Signed)
OB Discharge Summary     Patient Name: Diana Clark DOB: 1990-12-31 MRN: [id number]  Date of admission: 02/28/2018 Delivering MD: [name]   Date of discharge: 03/02/2018  Admitting diagnosis: INDUCTION Intrauterine pregnancy: [redacted]w[redacted]d     Secondary diagnosis:  Principal Problem:   SVD (spontaneous vaginal delivery) Active Problems:   Normal pregnancy in multigravida in third trimester      Discharge diagnosis: Term Pregnancy [place] Hospital course:  Induction of Labor With Vaginal Delivery   27 y.o. yo [id number] at [redacted]w[redacted]d was admitted to the hospital 02/28/2018 for induction of labor.  Indication for induction: Favorable cervix at term.  Patient had an uncomplicated labor course as follows: Membrane Rupture Time/Date: 4:53 PM ,02/28/2018   Intrapartum Procedures: Episiotomy: None [1]                                         Lacerations:  None [1]  Patient had delivery of a Viable infant.  Information for the patient's newborn:  Dealva, Lafoy [[id number]]      03/01/2018  Details of delivery can be found in separate delivery note.  Patient had a routine postpartum course. Patient is discharged home 03/02/18.  Physical exam  Vitals:   03/01/18 0845 03/01/18 1700 03/01/18 1810 03/02/18 0533  BP: 109/77 112/64 117/81 116/70  Pulse: 88 91 87 77  Resp: 18 18 18 17   Temp: 98.7 F (37.1 C)  97.8 F (36.6 C) 98.3 F (36.8 C)  TempSrc: Axillary  Oral Oral  SpO2: 100% 100%  100%  Weight:      Height:       General: alert Lochia: appropriate Uterine Fundus: firm  Labs: Lab Results  Component Value Date   WBC 13.0 (H) 03/02/2018   HGB 10.4 (L) 03/02/2018   HCT 30.9 (L) 03/02/2018   MCV 87.8 03/02/2018   PLT 209 03/02/2018   No flowsheet data found.  Discharge instruction: per After Visit Summary and "Baby and Me Booklet".  After visit meds:  Allergies as of 03/02/2018   No Known Allergies     Medication List    STOP  taking these medications   aspirin-sod bicarb-citric acid 325 MG Tbef tablet Commonly known as:  ALKA-SELTZER   Polyethyl Glycol-Propyl Glycol 0.4-0.3 % Gel ophthalmic gel Commonly known as:  SYSTANE     TAKE these medications   cetirizine 10 MG tablet Commonly known as:  ZYRTEC Take 1 tablet (10 mg total) by mouth daily. What changed:    when to take this  reasons to take this   ibuprofen 600 MG tablet Commonly known as:  ADVIL,MOTRIN Take 1 tablet (600 mg total) by mouth every 6 (six) hours.   multivitamin-prenatal 27-0.8 MG Tabs tablet Take 1 tablet by mouth daily at 12 noon.       Diet: routine diet  Activity: Advance as tolerated. Pelvic rest for 6 weeks.   Outpatient follow up:6 weeks  Newborn Data: Live born female  Birth Weight: 7 lb 2.3 oz (3240 g) APGAR: 30, 9  Newborn Delivery   Birth date/time:  03/01/2018 01:38:00 Delivery type:  Vaginal, Spontaneous     Baby Feeding: Breast Disposition:home with mother  03/02/2018 Clarene Duke, MD

## 2018-03-02 NOTE — Discharge Instructions (Signed)
As per discharge pamphlet °

## 2018-05-26 ENCOUNTER — Encounter (HOSPITAL_COMMUNITY): Payer: Self-pay | Admitting: *Deleted

## 2018-05-26 ENCOUNTER — Inpatient Hospital Stay (HOSPITAL_COMMUNITY)
Admission: AD | Admit: 2018-05-26 | Discharge: 2018-05-26 | Disposition: A | Discharge status: Home / Self Care | Payer: Medicaid Other | Source: Ambulatory Visit | Attending: Obstetrics and Gynecology | Admitting: Obstetrics and Gynecology

## 2018-05-26 ENCOUNTER — Inpatient Hospital Stay (HOSPITAL_COMMUNITY): Payer: Medicaid Other

## 2018-05-26 ENCOUNTER — Other Ambulatory Visit: Payer: Self-pay

## 2018-05-26 DIAGNOSIS — Z8619 Personal history of other infectious and parasitic diseases: Secondary | ICD-10-CM | POA: Diagnosis not present

## 2018-05-26 DIAGNOSIS — M549 Dorsalgia, unspecified: Secondary | ICD-10-CM | POA: Insufficient documentation

## 2018-05-26 DIAGNOSIS — O26891 Other specified pregnancy related conditions, first trimester: Secondary | ICD-10-CM | POA: Insufficient documentation

## 2018-05-26 DIAGNOSIS — Z3201 Encounter for pregnancy test, result positive: Secondary | ICD-10-CM | POA: Insufficient documentation

## 2018-05-26 DIAGNOSIS — O99331 Smoking (tobacco) complicating pregnancy, first trimester: Secondary | ICD-10-CM | POA: Diagnosis not present

## 2018-05-26 DIAGNOSIS — R109 Unspecified abdominal pain: Secondary | ICD-10-CM

## 2018-05-26 DIAGNOSIS — O3411 Maternal care for benign tumor of corpus uteri, first trimester: Secondary | ICD-10-CM | POA: Insufficient documentation

## 2018-05-26 DIAGNOSIS — N760 Acute vaginitis: Secondary | ICD-10-CM | POA: Diagnosis not present

## 2018-05-26 DIAGNOSIS — Z3480 Encounter for supervision of other normal pregnancy, unspecified trimester: Secondary | ICD-10-CM

## 2018-05-26 DIAGNOSIS — B9689 Other specified bacterial agents as the cause of diseases classified elsewhere: Secondary | ICD-10-CM | POA: Insufficient documentation

## 2018-05-26 DIAGNOSIS — O23591 Infection of other part of genital tract in pregnancy, first trimester: Secondary | ICD-10-CM | POA: Insufficient documentation

## 2018-05-26 DIAGNOSIS — F17210 Nicotine dependence, cigarettes, uncomplicated: Secondary | ICD-10-CM | POA: Diagnosis not present

## 2018-05-26 DIAGNOSIS — O09891 Supervision of other high risk pregnancies, first trimester: Secondary | ICD-10-CM

## 2018-05-26 DIAGNOSIS — R1030 Lower abdominal pain, unspecified: Secondary | ICD-10-CM | POA: Insufficient documentation

## 2018-05-26 DIAGNOSIS — D259 Leiomyoma of uterus, unspecified: Secondary | ICD-10-CM | POA: Diagnosis not present

## 2018-05-26 DIAGNOSIS — R110 Nausea: Secondary | ICD-10-CM | POA: Diagnosis not present

## 2018-05-26 DIAGNOSIS — Z3A01 Less than 8 weeks gestation of pregnancy: Secondary | ICD-10-CM | POA: Insufficient documentation

## 2018-05-26 DIAGNOSIS — Z8249 Family history of ischemic heart disease and other diseases of the circulatory system: Secondary | ICD-10-CM | POA: Diagnosis not present

## 2018-05-26 DIAGNOSIS — O3680X0 Pregnancy with inconclusive fetal viability, not applicable or unspecified: Secondary | ICD-10-CM

## 2018-05-26 LAB — WET PREP, GENITAL
Sperm: NONE SEEN
TRICH WET PREP: NONE SEEN
Yeast Wet Prep HPF POC: NONE SEEN

## 2018-05-26 LAB — URINALYSIS, ROUTINE W REFLEX MICROSCOPIC
Bilirubin Urine: NEGATIVE
Glucose, UA: NEGATIVE mg/dL
Hgb urine dipstick: NEGATIVE
KETONES UR: NEGATIVE mg/dL
LEUKOCYTES UA: NEGATIVE
NITRITE: NEGATIVE
PH: 5 (ref 5.0–8.0)
Protein, ur: NEGATIVE mg/dL
SPECIFIC GRAVITY, URINE: 1.016 (ref 1.005–1.030)

## 2018-05-26 LAB — POCT PREGNANCY, URINE: Preg Test, Ur: POSITIVE — AB

## 2018-05-26 MED ORDER — METRONIDAZOLE 500 MG PO TABS: 500.0000 mg | ORAL_TABLET | Freq: Two times a day (BID) | ORAL | 0 refills | Status: DC

## 2018-05-26 NOTE — MAU Note (Signed)
Urine in lab 

## 2018-05-26 NOTE — MAU Note (Signed)
Came in because she thinks she is preg. Has been nauseated for 2 wks.  Cramping in lower abd and back for a wk. Feels like period is going to start.  +HPT July 1- 4, several positive.

## 2018-05-26 NOTE — MAU Provider Note (Signed)
History     CSN: [id number]  Arrival date and time: 05/26/18 1205   First Provider Initiated Contact with Patient 05/26/18 1420      Chief Complaint  Patient presents with  . Abdominal Pain  . Back Pain  . Nausea  . Possible Pregnancy   HPI  Diana Clark is 27 y.o. [id number] here for abdominal cramping x two weeks in the setting of positive home pregnancy test May 15, 2018.  Reports bilateral low abdominal cramping 4-5/10. Does not radiate, no aggravating or alleviating factors. This is an unplanned pregnancy, patient is s/p SVD 03/01/2018. Denies vaginal bleeding, leaking of fluid, fever, falls, or recent illness. Sexually active, using pull-out method for contraception. Bottle feeding her infant.    OB History    Gravida  3   Para  2   Term  2   Preterm      AB      Living  2     SAB      TAB      Ectopic      Multiple  0   Live Births  2           Past Medical History:  Diagnosis Date  . Hx of chlamydia infection   . Hx of gonorrhea   . Medical history non-contributory   . SVD (spontaneous vaginal delivery) 08/28/2014  . SVD (spontaneous vaginal delivery) 03/01/2018    Past Surgical History:  Procedure Laterality Date  . INCISE AND DRAIN ABCESS  2005   located on the right buttocks  . NO PAST SURGERIES      Family History  Problem Relation Age of Onset  . Hypertension Mother   . Hypertension Paternal Grandmother     Social History   Tobacco Use  . Smoking status: Current Every Day Smoker    Packs/day: 4.00    Types: Cigarettes  . Smokeless tobacco: Former Systems developer    Quit date: 01/05/2014  Substance Use Topics  . Alcohol use: Not Currently  . Drug use: No    Allergies: No Known Allergies  Medications Prior to Admission  Medication Sig Dispense Refill Last Dose  . cetirizine (ZYRTEC) 10 MG tablet Take 1 tablet (10 mg total) by mouth daily. (Patient taking differently: Take 10 mg by mouth daily as needed for allergies or rhinitis. ) 30  tablet 0 prn  . ibuprofen (ADVIL,MOTRIN) 600 MG tablet Take 1 tablet (600 mg total) by mouth every 6 (six) hours. 30 tablet 0   . Prenatal Vit-Fe Fumarate-FA (MULTIVITAMIN-PRENATAL) 27-0.8 MG TABS tablet Take 1 tablet by mouth daily at 12 noon.   02/27/2018 at Unknown time    Review of Systems  Respiratory: Negative for shortness of breath.   Gastrointestinal: Positive for abdominal pain and nausea. Negative for diarrhea and vomiting.  Genitourinary: Negative for vaginal bleeding, vaginal discharge and vaginal pain.  All other systems reviewed and are negative.  Physical Exam   Blood pressure 117/77, pulse 84, temperature 98.7 F (37.1 C), temperature source Oral, resp. rate 18, height 5' 4.96" (1.65 m), weight 178 lb 1.9 oz (80.8 kg), last menstrual period 04/14/2018, SpO2 98 %, unknown if currently breastfeeding.  Physical Exam  Nursing note and vitals reviewed. Constitutional: She is oriented to person, place, and time. She appears well-developed and well-nourished.  HENT:  Head: Normocephalic.  Cardiovascular: Normal rate, regular rhythm, normal heart sounds and intact distal pulses.  Respiratory: Effort normal and breath sounds normal.  GI: Soft. Bowel sounds  are normal.  Genitourinary: Uterus normal. Vaginal discharge found.  Musculoskeletal: Normal range of motion.  Neurological: She is alert and oriented to person, place, and time. She has normal reflexes.  Skin: Skin is warm and dry.  Psychiatric: She has a normal mood and affect. Her behavior is normal. Judgment and thought content normal.    MAU Course  Procedures  MDM Positive pregnancy test Pt aware of leiomyoma  BP 117/77 (BP Location: Right Arm)   Pulse 84   Temp 98.7 F (37.1 C) (Oral)   Resp 18   Ht 5' 4.96" (1.65 m)   Wt 178 lb 1.9 oz (80.8 kg)   LMP 04/14/2018   SpO2 98%   BMI 29.68 kg/m    Orders Placed This Encounter  Procedures  . Wet prep, genital  . Culture, OB Urine  . US OB LESS THAN 14  WEEKS WITH OB TRANSVAGINAL  . Urinalysis, Routine w reflex microscopic  . Pregnancy, urine POC  . Discharge patient   Results for orders placed or performed during the hospital encounter of 05/26/18 (from the past 24 hour(s))  Urinalysis, Routine w reflex microscopic     Status: None   Collection Time: 05/26/18 12:35 PM  Result Value Ref Range   Color, Urine YELLOW YELLOW   APPearance CLEAR CLEAR   Specific Gravity, Urine 1.016 1.005 - 1.030   pH 5.0 5.0 - 8.0   Glucose, UA NEGATIVE NEGATIVE mg/dL   Hgb urine dipstick NEGATIVE NEGATIVE   Bilirubin Urine NEGATIVE NEGATIVE   Ketones, ur NEGATIVE NEGATIVE mg/dL   Protein, ur NEGATIVE NEGATIVE mg/dL   Nitrite NEGATIVE NEGATIVE   Leukocytes, UA NEGATIVE NEGATIVE  Pregnancy, urine POC     Status: Abnormal   Collection Time: 05/26/18 12:38 PM  Result Value Ref Range   Preg Test, Ur POSITIVE (A) NEGATIVE  Wet prep, genital     Status: Abnormal   Collection Time: 05/26/18  2:32 PM  Result Value Ref Range   Yeast Wet Prep HPF POC NONE SEEN NONE SEEN   Trich, Wet Prep NONE SEEN NONE SEEN   Clue Cells Wet Prep HPF POC PRESENT (A) NONE SEEN   WBC, Wet Prep HPF POC MANY (A) NONE SEEN   Sperm NONE SEEN    US Ob Less Than 14 Weeks With Ob Transvaginal  Result Date: 05/26/2018 CLINICAL DATA:  Abdominal pain in first trimester of pregnancy, pregnancy of unknown location, LMP 04/14/2018 EXAM: OBSTETRIC <14 WK [place] AND TRANSVAGINAL OB US TECHNIQUE: Both transabdominal and transvaginal ultrasound examinations were performed for complete evaluation of the gestation as well as the maternal uterus, adnexal regions, and pelvic cul-de-sac. Transvaginal technique was performed to assess early pregnancy. COMPARISON:  None for this gestation FINDINGS: Intrauterine gestational sac: Present, single Yolk sac:  Present Embryo:  Not visualized Cardiac Activity: N/A Heart Rate: N/A  bpm MSD: 6.6 mm   5 w   2 d Subchorionic hemorrhage:  None visualize Maternal  uterus/adnexae: Small posterior wall uterine leiomyoma 16 x 14 x 16 mm. RIGHT ovary normal size and morphology 3.5 x 1.2 x 1.5 cm. LEFT ovary measures 4.0 x 1.9 x 2.3 cm and contains a small corpus luteum. No free pelvic fluid or adnexal masses. IMPRESSION: Single intrauterine gestation at 5 weeks 2 days EGA by mean sac diameter. A yolk sac is visualized but no fetal pole is seen to establish viability. Small uterine leiomyoma 16 mm greatest size. Electronically Signed   By: Crist Infante.D.  On: 05/26/2018 15:42   Meds ordered this encounter  Medications  . metroNIDAZOLE (FLAGYL) 500 MG tablet    Sig: Take 1 tablet (500 mg total) by mouth 2 (two) times daily.    Dispense:  14 tablet    Refill:  0    Order Specific Question:   Supervising Provider    Answer:   Donnamae Jude [[id number]]     Assessment and Plan  --27 y.o. [id number] with SIUP at 5w 2d by US performed today, unable to confirm viability --Bacterial vaginosis in pregnancy, rx sent to pt pharmacy --Reviewed general ob first trimester precautions (see AVS) --Given list of prenatal care providers --Given list of safe medications in pregnancy --Discharge home in stable condition   Darlina Rumpf, CNM 05/26/2018, 4:06 PM

## 2018-05-26 NOTE — Discharge Instructions (Signed)
First Trimester of Pregnancy The first trimester of pregnancy is from week 1 until the end of week 13 (months 1 through 3). During this time, your baby will begin to develop inside you. At 6-8 weeks, the eyes and face are formed, and the heartbeat can be seen on ultrasound. At the end of 12 weeks, all the baby's organs are formed. Prenatal care is all the medical care you receive before the birth of your baby. Make sure you get good prenatal care and follow all of your doctor's instructions. Follow these instructions at home: Medicines  Take over-the-counter and prescription medicines only as told by your doctor. Some medicines are safe and some medicines are not safe during pregnancy.  Take a prenatal vitamin that contains at least 600 micrograms (mcg) of folic acid.  If you have trouble pooping (constipation), take medicine that will make your stool soft (stool softener) if your doctor approves. Eating and drinking  Eat regular, healthy meals.  Your doctor will tell you the amount of weight gain that is right for you.  Avoid raw meat and uncooked cheese.  If you feel sick to your stomach (nauseous) or throw up (vomit): ? Eat 4 or 5 small meals a day instead of 3 large meals. ? Try eating a few soda crackers. ? Drink liquids between meals instead of during meals.  To prevent constipation: ? Eat foods that are high in fiber, like fresh fruits and vegetables, whole grains, and beans. ? Drink enough fluids to keep your pee (urine) clear or pale yellow. Activity  Exercise only as told by your doctor. Stop exercising if you have cramps or pain in your lower belly (abdomen) or low back.  Do not exercise if it is too hot, too humid, or if you are in a place of great height (high altitude).  Try to avoid standing for long periods of time. Move your legs often if you must stand in one place for a long time.  Avoid heavy lifting.  Wear low-heeled shoes. Sit and stand up straight.  You  can have sex unless your doctor tells you not to. Relieving pain and discomfort  Wear a good support bra if your breasts are sore.  Take warm water baths (sitz baths) to soothe pain or discomfort caused by hemorrhoids. Use hemorrhoid cream if your doctor says it is okay.  Rest with your legs raised if you have leg cramps or low back pain.  If you have puffy, bulging veins (varicose veins) in your legs: ? Wear support hose or compression stockings as told by your doctor. ? Raise (elevate) your feet for 15 minutes, 3-4 times a day. ? Limit salt in your food. Prenatal care  Schedule your prenatal visits by the twelfth week of pregnancy.  Write down your questions. Take them to your prenatal visits.  Keep all your prenatal visits as told by your doctor. This is important. Safety  Wear your seat belt at all times when driving.  Make a list of emergency phone numbers. The list should include numbers for family, friends, the hospital, and police and fire departments. General instructions  Ask your doctor for a referral to a local prenatal class. Begin classes no later than at the start of month 6 of your pregnancy.  Ask for help if you need counseling or if you need help with nutrition. Your doctor can give you advice or tell you where to go for help.  Do not use hot tubs, steam rooms, or   saunas.  Do not douche or use tampons or scented sanitary pads.  Do not cross your legs for long periods of time.  Avoid all herbs and alcohol. Avoid drugs that are not approved by your doctor.  Do not use any tobacco products, including cigarettes, chewing tobacco, and electronic cigarettes. If you need help quitting, ask your doctor. You may get counseling or other support to help you quit.  Avoid cat litter boxes and soil used by cats. These carry germs that can cause birth defects in the baby and can cause a loss of your baby (miscarriage) or stillbirth.  Visit your dentist. At home, brush  your teeth with a soft toothbrush. Be gentle when you floss. Contact a doctor if:  You are dizzy.  You have mild cramps or pressure in your lower belly.  You have a nagging pain in your belly area.  You continue to feel sick to your stomach, you throw up, or you have watery poop (diarrhea).  You have a bad smelling fluid coming from your vagina.  You have pain when you pee (urinate).  You have increased puffiness (swelling) in your face, hands, legs, or ankles. Get help right away if:  You have a fever.  You are leaking fluid from your vagina.  You have spotting or bleeding from your vagina.  You have very bad belly cramping or pain.  You gain or lose weight rapidly.  You throw up blood. It may look like coffee grounds.  You are around people who have German measles, fifth disease, or chickenpox.  You have a very bad headache.  You have shortness of breath.  You have any kind of trauma, such as from a fall or a car accident. Summary  The first trimester of pregnancy is from week 1 until the end of week 13 (months 1 through 3).  To take care of yourself and your unborn baby, you will need to eat healthy meals, take medicines only if your doctor tells you to do so, and do activities that are safe for you and your baby.  Keep all follow-up visits as told by your doctor. This is important as your doctor will have to ensure that your baby is healthy and growing well. This information is not intended to replace advice given to you by your health care provider. Make sure you discuss any questions you have with your health care provider. Document Released: 04/19/2008 Document Revised: 11/09/2016 Document Reviewed: 11/09/2016 Elsevier Interactive Patient Education  2017 Elsevier Inc.  

## 2018-05-27 LAB — CULTURE, OB URINE: CULTURE: NO GROWTH

## 2018-05-29 LAB — GC/CHLAMYDIA PROBE AMP (~~LOC~~) NOT AT ARMC
CHLAMYDIA, DNA PROBE: NEGATIVE
NEISSERIA GONORRHEA: NEGATIVE

## 2018-07-24 LAB — OB RESULTS CONSOLE ABO/RH: RH TYPE: POSITIVE

## 2018-07-24 LAB — OB RESULTS CONSOLE HIV ANTIBODY (ROUTINE TESTING): HIV: NONREACTIVE

## 2018-07-24 LAB — OB RESULTS CONSOLE ANTIBODY SCREEN: ANTIBODY SCREEN: NEGATIVE

## 2018-07-24 LAB — OB RESULTS CONSOLE RUBELLA ANTIBODY, IGM: RUBELLA: IMMUNE

## 2018-07-24 LAB — OB RESULTS CONSOLE GC/CHLAMYDIA
Chlamydia: NEGATIVE
Gonorrhea: NEGATIVE

## 2018-07-24 LAB — OB RESULTS CONSOLE HEPATITIS B SURFACE ANTIGEN: HEP B S AG: NEGATIVE

## 2018-07-24 LAB — OB RESULTS CONSOLE RPR: RPR: NONREACTIVE

## 2018-11-15 NOTE — L&D Delivery Note (Signed)
Delivery Note Pt progressed to complete and pushed well.  At 8:57 PM a viable female was delivered via  (Presentation: vtx; LOP ).  APGAR: 9, 9; weight pending.   Placenta status: spontaneous, intact.  Cord:  with the following complications: shoulder cord x 1.   Anesthesia:  Epidural Episiotomy:  None Lacerations:  None Suture Repair: none Est. Blood Loss (mL):  357  Mom to postpartum.  Baby to Couplet care / Skin to Skin.  Will do circumcision in the office  Clarene Duke 01/16/2019, 9:14 PM

## 2018-11-20 IMAGING — US US OB < 14 WEEKS - US OB TV
1 series · 15 of 28 positions shown · non-contrast
Comparison: None for this gestation

CLINICAL DATA: Abdominal pain in first trimester of pregnancy,
pregnancy of unknown location, LMP 04/14/2018

EXAM:
OBSTETRIC <14 WK US AND TRANSVAGINAL OB US
TECHNIQUE: Both transabdominal and transvaginal ultrasound examinations were
performed for complete evaluation of the gestation as well as the
maternal uterus, adnexal regions, and pelvic cul-de-sac.
Transvaginal technique was performed to assess early pregnancy.

[Series 1: us ob < 14 weeks - us ob tv · 15 of 57 slices shown]
[im 1/57]
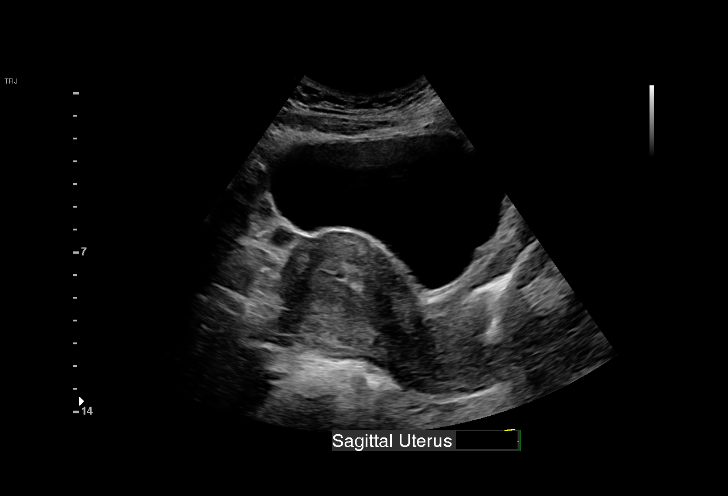
[im 5/57]
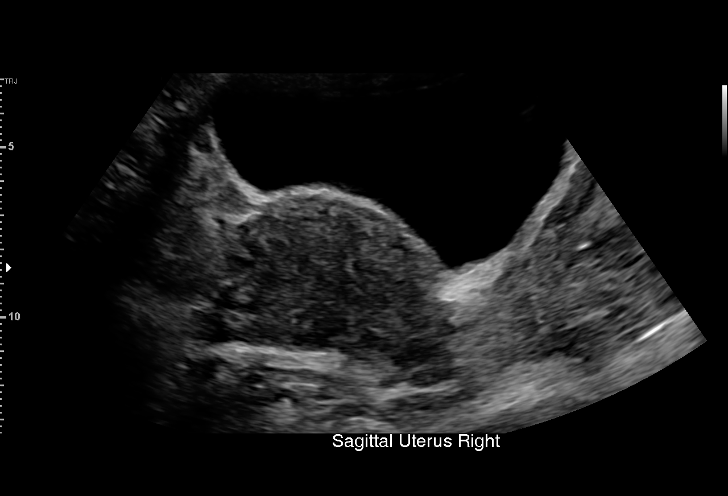
[im 9/57]
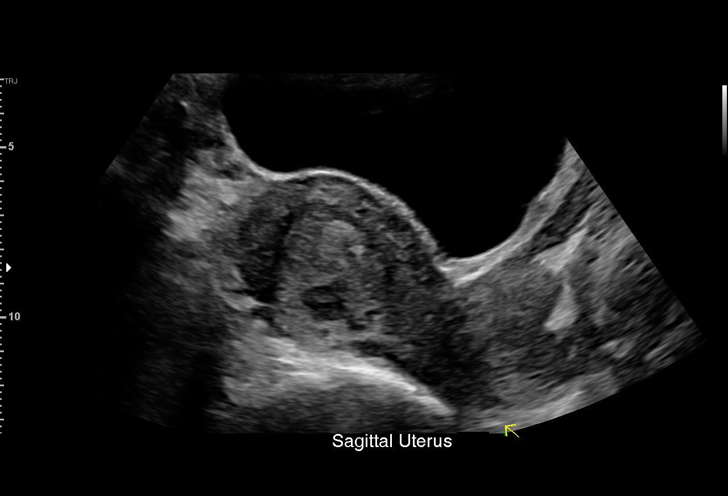
[im 13/57]
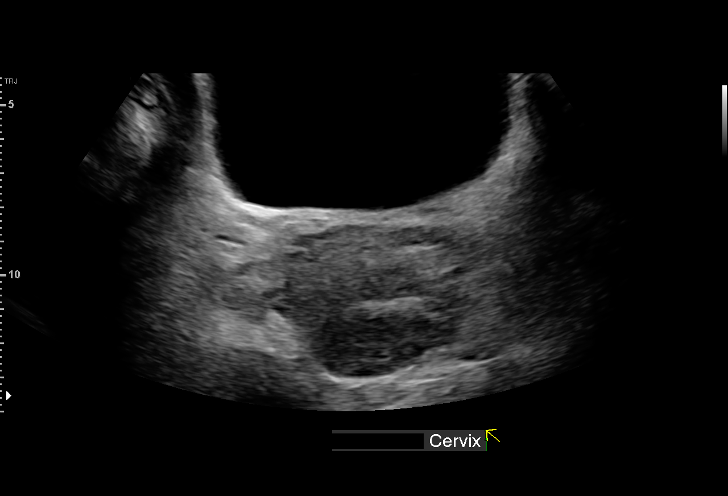
[im 17/57]
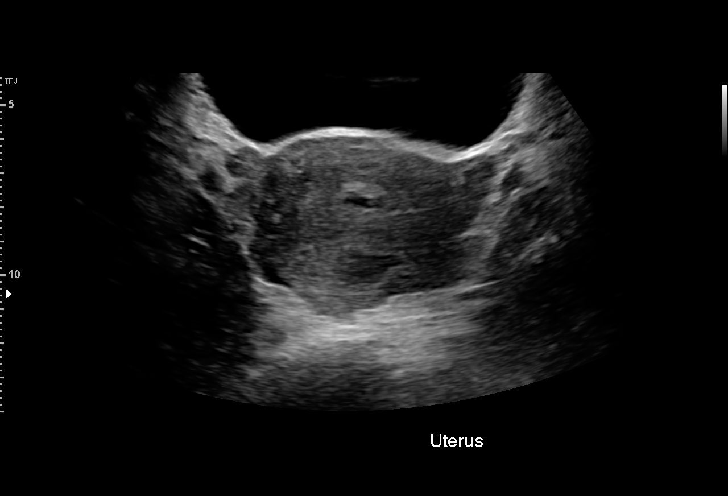
[im 21/57]
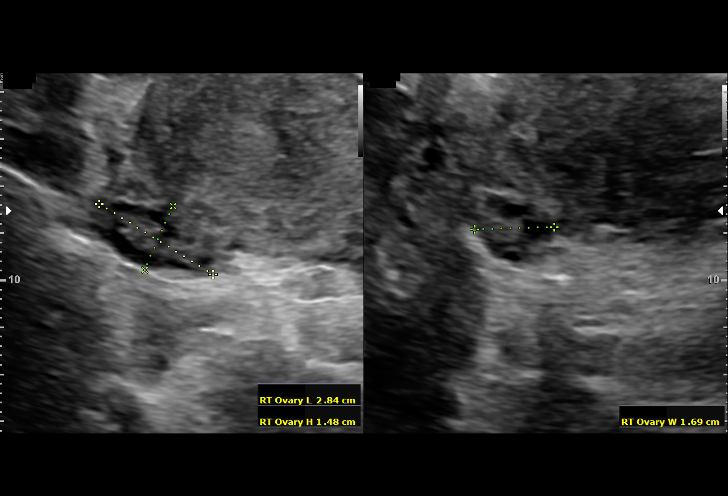
[im 25/57]
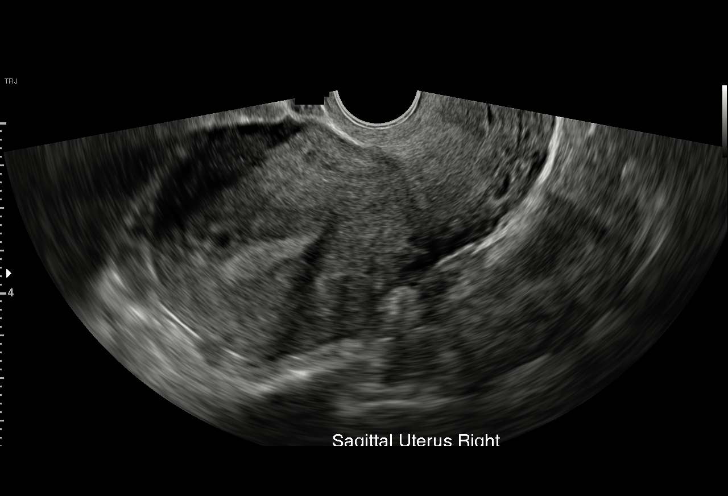
[im 30/57]
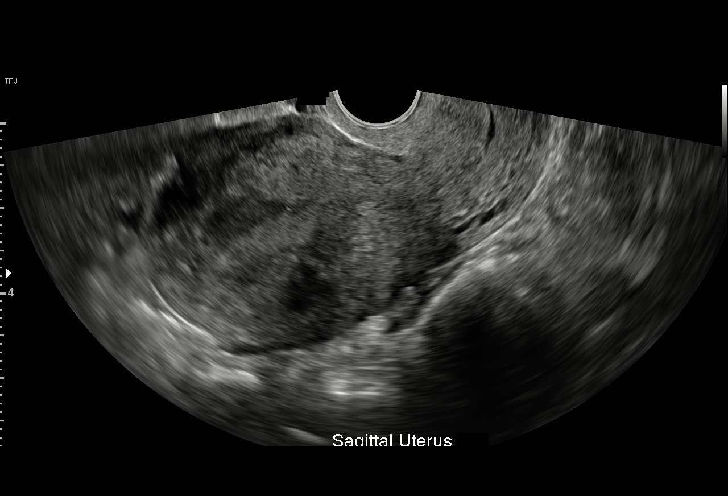
[im 32/57]
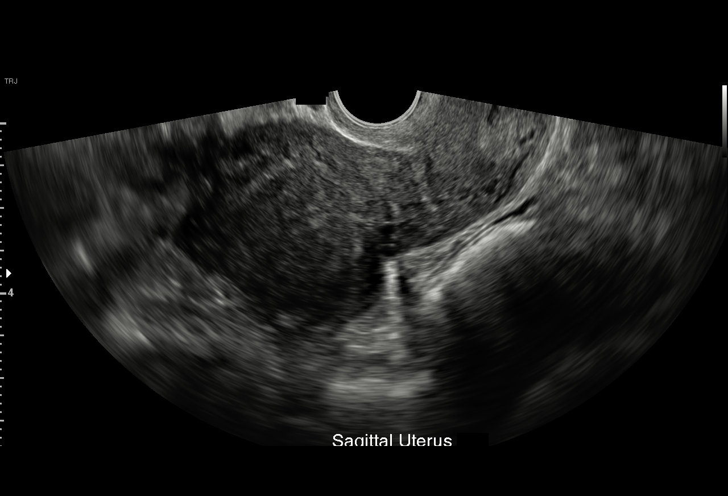
[im 36/57]
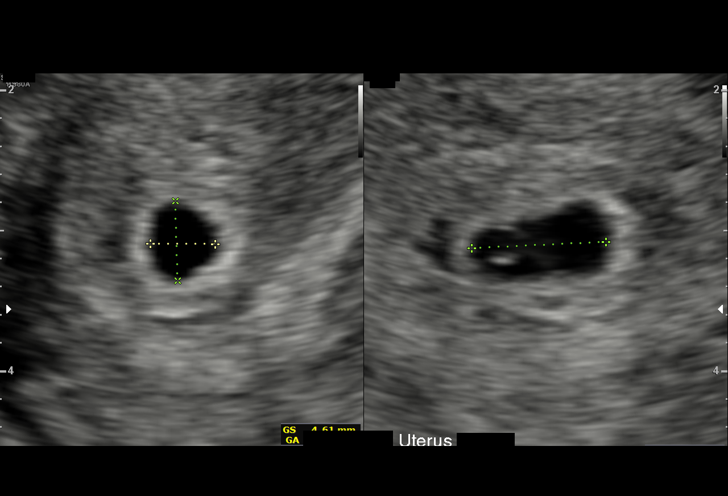
[im 40/57]
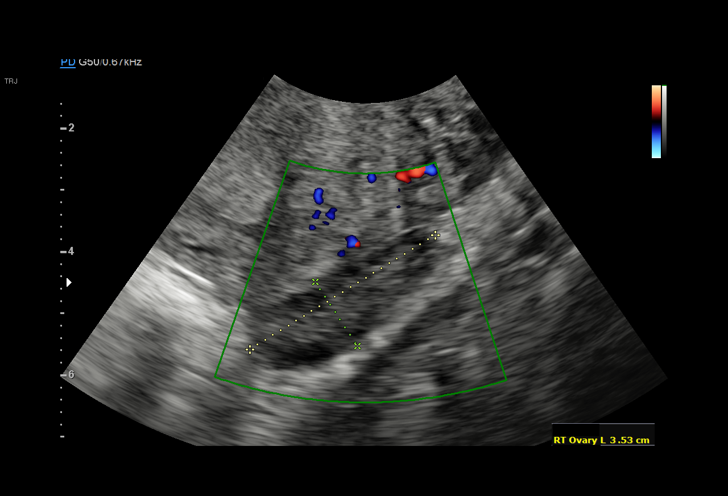
[im 44/57]
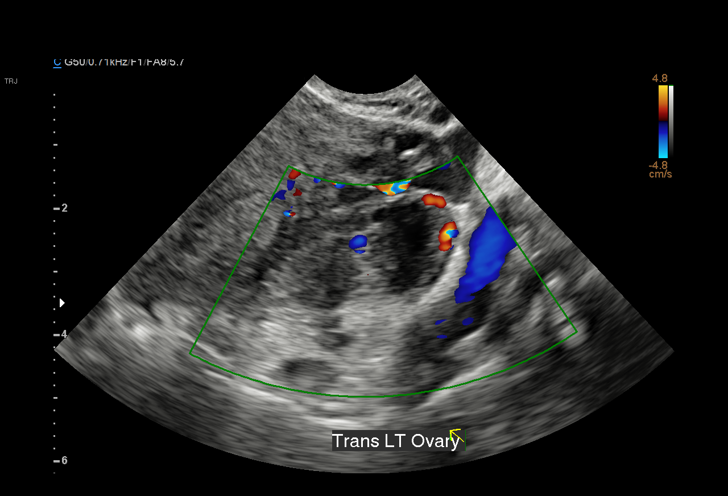
[im 48/57]
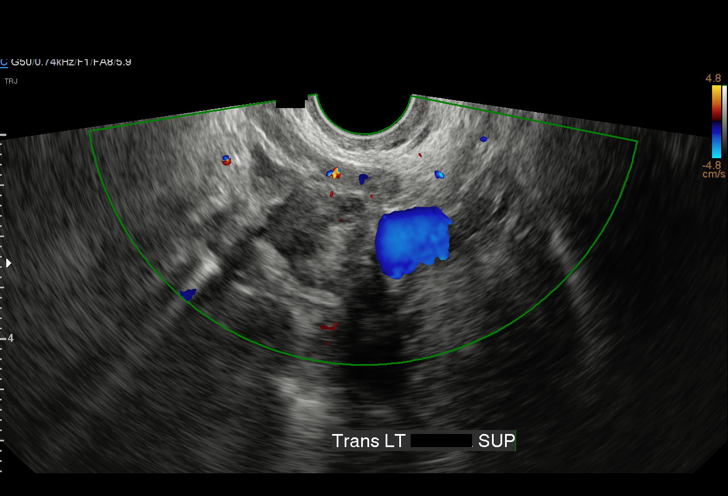
[im 52/57]
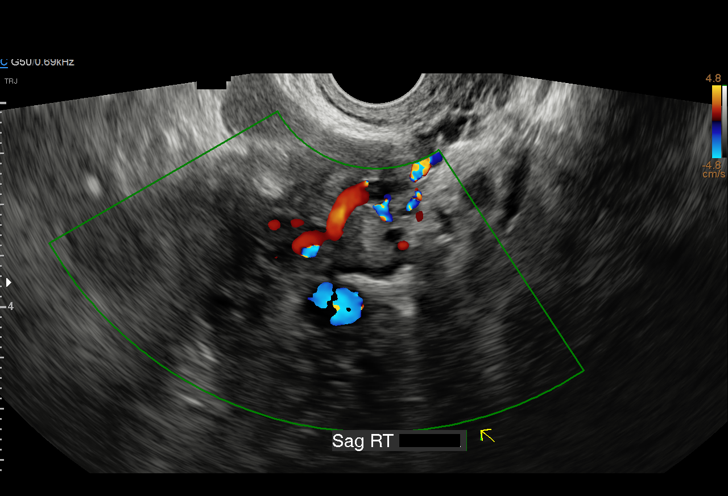
[im 57/57]
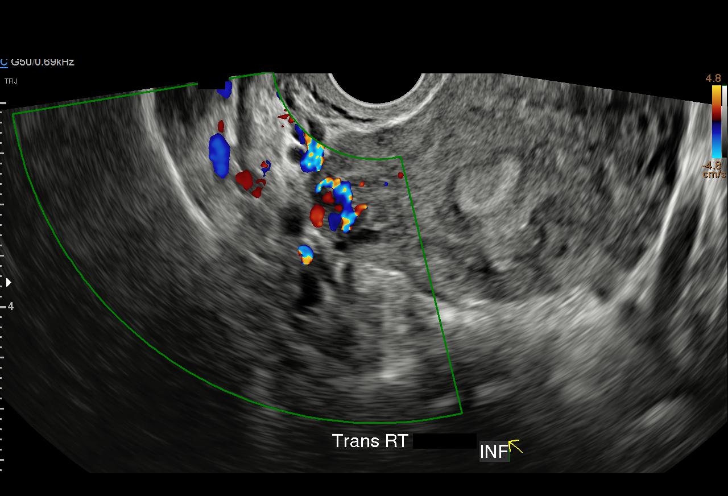

[15 of 28 positions shown; findings below may reference images not displayed]

FINDINGS: Intrauterine gestational sac: Present, single

Yolk sac:  Present

Embryo:  Not visualized

Cardiac Activity: N/A

Heart Rate: N/A  bpm

MSD: 6.6 mm   5 w   2 d

Subchorionic hemorrhage:  None visualize

Maternal uterus/adnexae:

Small posterior wall uterine leiomyoma 16 x 14 x 16 mm.

RIGHT ovary normal size and morphology 3.5 x 1.2 x 1.5 cm.

LEFT ovary measures 4.0 x 1.9 x 2.3 cm and contains a small corpus
luteum.

No free pelvic fluid or adnexal masses.
IMPRESSION: Single intrauterine gestation at 5 weeks 2 days EGA by mean sac
diameter.

A yolk sac is visualized but no fetal pole is seen to establish
viability.

Small uterine leiomyoma 16 mm greatest size.

## 2018-12-29 LAB — OB RESULTS CONSOLE GBS: GBS: POSITIVE

## 2019-01-09 ENCOUNTER — Inpatient Hospital Stay (HOSPITAL_COMMUNITY)
Admission: AD | Admit: 2019-01-09 | Discharge: 2019-01-09 | Disposition: A | Discharge status: Home / Self Care | Payer: Medicaid Other | Source: Ambulatory Visit | Attending: Obstetrics and Gynecology | Admitting: Obstetrics and Gynecology

## 2019-01-09 ENCOUNTER — Encounter (HOSPITAL_COMMUNITY): Payer: Self-pay | Admitting: *Deleted

## 2019-01-09 DIAGNOSIS — Z3A38 38 weeks gestation of pregnancy: Secondary | ICD-10-CM | POA: Insufficient documentation

## 2019-01-09 DIAGNOSIS — O471 False labor at or after 37 completed weeks of gestation: Secondary | ICD-10-CM | POA: Diagnosis present

## 2019-01-09 NOTE — Discharge Instructions (Signed)
Braxton Hicks Contractions Contractions of the uterus can occur throughout pregnancy, but they are not always a sign that you are in labor. You may have practice contractions called Braxton Hicks contractions. These false labor contractions are sometimes confused with true labor. What are Braxton Hicks contractions? Braxton Hicks contractions are tightening movements that occur in the muscles of the uterus before labor. Unlike true labor contractions, these contractions do not result in opening (dilation) and thinning of the cervix. Toward the end of pregnancy (32-34 weeks), Braxton Hicks contractions can happen more often and may become stronger. These contractions are sometimes difficult to tell apart from true labor because they can be very uncomfortable. You should not feel embarrassed if you go to the hospital with false labor. Sometimes, the only way to tell if you are in true labor is for your health care provider to look for changes in the cervix. The health care provider will do a physical exam and may monitor your contractions. If you are not in true labor, the exam should show that your cervix is not dilating and your water has not broken. If there are no other health problems associated with your pregnancy, it is completely safe for you to be sent home with false labor. You may continue to have Braxton Hicks contractions until you go into true labor. How to tell the difference between true labor and false labor True labor  Contractions last 30-70 seconds.  Contractions become very regular.  Discomfort is usually felt in the top of the uterus, and it spreads to the lower abdomen and low back.  Contractions do not go away with walking.  Contractions usually become more intense and increase in frequency.  The cervix dilates and gets thinner. False labor  Contractions are usually shorter and not as strong as true labor contractions.  Contractions are usually irregular.  Contractions  are often felt in the front of the lower abdomen and in the groin.  Contractions may go away when you walk around or change positions while lying down.  Contractions get weaker and are shorter-lasting as time goes on.  The cervix usually does not dilate or become thin. Follow these instructions at home:   Take over-the-counter and prescription medicines only as told by your health care provider.  Keep up with your usual exercises and follow other instructions from your health care provider.  Eat and drink lightly if you think you are going into labor.  If Braxton Hicks contractions are making you uncomfortable: ? Change your position from lying down or resting to walking, or change from walking to resting. ? Sit and rest in a tub of warm water. ? Drink enough fluid to keep your urine pale yellow. Dehydration may cause these contractions. ? Do slow and deep breathing several times an hour.  Keep all follow-up prenatal visits as told by your health care provider. This is important. Contact a health care provider if:  You have a fever.  You have continuous pain in your abdomen. Get help right away if:  Your contractions become stronger, more regular, and closer together.  You have fluid leaking or gushing from your vagina.  You pass blood-tinged mucus (bloody show).  You have bleeding from your vagina.  You have low back pain that you never had before.  You feel your baby's head pushing down and causing pelvic pressure.  Your baby is not moving inside you as much as it used to. Summary  Contractions that occur before labor are   called Braxton Hicks contractions, false labor, or practice contractions.  Braxton Hicks contractions are usually shorter, weaker, farther apart, and less regular than true labor contractions. True labor contractions usually become progressively stronger and regular, and they become more frequent.  Manage discomfort from Braxton Hicks contractions  by changing position, resting in a warm bath, drinking plenty of water, or practicing deep breathing. This information is not intended to replace advice given to you by your health care provider. Make sure you discuss any questions you have with your health care provider. Document Released: 03/17/2017 Document Revised: 08/16/2017 Document Reviewed: 03/17/2017 Elsevier Interactive Patient Education  2019 [employer]vier Inc.  

## 2019-01-09 NOTE — MAU Note (Signed)
PT SAYS HAD RED  BLOOD IN TOILET-   WAS PINK WHEN SHE WIPED.   UC STRONG -  MED IN PAIN.   DENIES HSV AND MRSA.  GBS-POSITIVE.    PNC  WITH DR BOVARD-    HAS AN APPOINTMENT  TOMORROW.    2 WEEKS AGO - 1 CM.

## 2019-01-09 NOTE — MAU Provider Note (Signed)
Ms. NANDANA KROLIKOWSKI is a [id number] at [redacted]w[redacted]d seen in MAU for labor. RN labor check, not seen by provider. SVE by RN Dilation: 1 Effacement (%): 50 Cervical Position: Posterior Presentation: Undeterminable Exam by:: Amber Stovall RN   NST - FHR: 150 bpm / moderate variability / accels present / decels absent / TOCO: irregular UC's  Plan:  D/C home with labor precautions Keep scheduled appt with [facility] on 01/09/2019  Laury Deep, CNM  01/09/2019 2:52 AM

## 2019-01-12 ENCOUNTER — Telehealth (HOSPITAL_COMMUNITY): Payer: Self-pay | Admitting: *Deleted

## 2019-01-12 NOTE — Telephone Encounter (Signed)
Preadmission screen  

## 2019-01-16 ENCOUNTER — Inpatient Hospital Stay (HOSPITAL_COMMUNITY): Payer: Medicaid Other | Admitting: Anesthesiology

## 2019-01-16 ENCOUNTER — Encounter (HOSPITAL_COMMUNITY): Payer: Self-pay | Admitting: Anesthesiology

## 2019-01-16 ENCOUNTER — Inpatient Hospital Stay (HOSPITAL_COMMUNITY)
Admission: AD | Admit: 2019-01-16 | Discharge: 2019-01-18 | DRG: 807 | Disposition: A | Discharge status: Home / Self Care | Payer: Medicaid Other | Attending: Obstetrics and Gynecology | Admitting: Obstetrics and Gynecology

## 2019-01-16 ENCOUNTER — Other Ambulatory Visit (HOSPITAL_COMMUNITY): Payer: Self-pay | Admitting: *Deleted

## 2019-01-16 DIAGNOSIS — Z3A39 39 weeks gestation of pregnancy: Secondary | ICD-10-CM | POA: Diagnosis not present

## 2019-01-16 DIAGNOSIS — F17210 Nicotine dependence, cigarettes, uncomplicated: Secondary | ICD-10-CM | POA: Diagnosis present

## 2019-01-16 DIAGNOSIS — E669 Obesity, unspecified: Secondary | ICD-10-CM | POA: Diagnosis present

## 2019-01-16 DIAGNOSIS — O99214 Obesity complicating childbirth: Secondary | ICD-10-CM | POA: Diagnosis present

## 2019-01-16 DIAGNOSIS — O9902 Anemia complicating childbirth: Secondary | ICD-10-CM | POA: Diagnosis present

## 2019-01-16 DIAGNOSIS — O99824 Streptococcus B carrier state complicating childbirth: Secondary | ICD-10-CM | POA: Diagnosis present

## 2019-01-16 DIAGNOSIS — D649 Anemia, unspecified: Secondary | ICD-10-CM | POA: Diagnosis present

## 2019-01-16 DIAGNOSIS — O99334 Smoking (tobacco) complicating childbirth: Secondary | ICD-10-CM | POA: Diagnosis present

## 2019-01-16 DIAGNOSIS — O26893 Other specified pregnancy related conditions, third trimester: Secondary | ICD-10-CM | POA: Diagnosis present

## 2019-01-16 LAB — CBC
HCT: 34.8 % — ABNORMAL LOW (ref 36.0–46.0)
Hemoglobin: 11.4 g/dL — ABNORMAL LOW (ref 12.0–15.0)
MCH: 28.4 pg (ref 26.0–34.0)
MCHC: 32.8 g/dL (ref 30.0–36.0)
MCV: 86.6 fL (ref 80.0–100.0)
PLATELETS: 279 10*3/uL (ref 150–400)
RBC: 4.02 MIL/uL (ref 3.87–5.11)
RDW: 14.3 % (ref 11.5–15.5)
WBC: 11.2 10*3/uL — ABNORMAL HIGH (ref 4.0–10.5)
nRBC: 0 % (ref 0.0–0.2)

## 2019-01-16 LAB — TYPE AND SCREEN
ABO/RH(D): B POS
Antibody Screen: NEGATIVE

## 2019-01-16 LAB — ABO/RH: ABO/RH(D): B POS

## 2019-01-16 MED ORDER — LACTATED RINGERS IV SOLN: 500.0000 mL | Freq: Once | INTRAVENOUS | Status: DC

## 2019-01-16 MED ORDER — LIDOCAINE HCL (PF) 1 % IJ SOLN
INTRAMUSCULAR | Status: DC | PRN
  Administered 2019-01-16: 4 mL via EPIDURAL
  Administered 2019-01-16: 5 mL via EPIDURAL

## 2019-01-16 MED ORDER — EPHEDRINE 5 MG/ML INJ: 10.0000 mg | INTRAVENOUS | Status: DC | PRN

## 2019-01-16 MED ORDER — LIDOCAINE HCL (PF) 1 % IJ SOLN: 30.0000 mL | INTRAMUSCULAR | Status: DC | PRN

## 2019-01-16 MED ORDER — FENTANYL-BUPIVACAINE-NACL 0.5-0.125-0.9 MG/250ML-% EP SOLN
EPIDURAL | Status: AC
  Filled 2019-01-16: qty 250

## 2019-01-16 MED ORDER — LACTATED RINGERS IV SOLN: 500.0000 mL | INTRAVENOUS | Status: DC | PRN

## 2019-01-16 MED ORDER — OXYCODONE-ACETAMINOPHEN 5-325 MG PO TABS: 1.0000 | ORAL_TABLET | ORAL | Status: DC | PRN

## 2019-01-16 MED ORDER — ONDANSETRON HCL 4 MG/2ML IJ SOLN
4.0000 mg | Freq: Four times a day (QID) | INTRAMUSCULAR | Status: DC | PRN
  Administered 2019-01-16: 4 mg via INTRAVENOUS
  Filled 2019-01-16: qty 2

## 2019-01-16 MED ORDER — SODIUM CHLORIDE 0.9 % IV SOLN
2.0000 g | Freq: Once | INTRAVENOUS | Status: DC
  Filled 2019-01-16: qty 2000

## 2019-01-16 MED ORDER — OXYTOCIN BOLUS FROM INFUSION
500.0000 mL | Freq: Once | INTRAVENOUS | Status: AC
  Administered 2019-01-16: 500 mL via INTRAVENOUS

## 2019-01-16 MED ORDER — DIPHENHYDRAMINE HCL 50 MG/ML IJ SOLN: 12.5000 mg | INTRAMUSCULAR | Status: DC | PRN

## 2019-01-16 MED ORDER — ACETAMINOPHEN 325 MG PO TABS: 650.0000 mg | ORAL_TABLET | ORAL | Status: DC | PRN

## 2019-01-16 MED ORDER — PHENYLEPHRINE 40 MCG/ML (10ML) SYRINGE FOR IV PUSH (FOR BLOOD PRESSURE SUPPORT): 80.0000 ug | PREFILLED_SYRINGE | INTRAVENOUS | Status: DC | PRN

## 2019-01-16 MED ORDER — FENTANYL-BUPIVACAINE-NACL 0.5-0.125-0.9 MG/250ML-% EP SOLN: 12.0000 mL/h | EPIDURAL | Status: DC | PRN

## 2019-01-16 MED ORDER — PHENYLEPHRINE 40 MCG/ML (10ML) SYRINGE FOR IV PUSH (FOR BLOOD PRESSURE SUPPORT)
80.0000 ug | PREFILLED_SYRINGE | INTRAVENOUS | Status: DC | PRN
  Filled 2019-01-16 (×2): qty 10

## 2019-01-16 MED ORDER — OXYCODONE-ACETAMINOPHEN 5-325 MG PO TABS: 2.0000 | ORAL_TABLET | ORAL | Status: DC | PRN

## 2019-01-16 MED ORDER — SODIUM CHLORIDE (PF) 0.9 % IJ SOLN
INTRAMUSCULAR | Status: DC | PRN
  Administered 2019-01-16: 12 mL/h via EPIDURAL

## 2019-01-16 MED ORDER — SOD CITRATE-CITRIC ACID 500-334 MG/5ML PO SOLN
30.0000 mL | ORAL | Status: DC | PRN
  Administered 2019-01-16: 30 mL via ORAL
  Filled 2019-01-16: qty 15

## 2019-01-16 MED ORDER — OXYTOCIN 40 UNITS IN NORMAL SALINE INFUSION - SIMPLE MED
2.5000 [IU]/h | INTRAVENOUS | Status: DC
  Administered 2019-01-16: 2.5 [IU]/h via INTRAVENOUS
  Filled 2019-01-16: qty 1000

## 2019-01-16 MED ORDER — LACTATED RINGERS IV SOLN: INTRAVENOUS | Status: DC

## 2019-01-16 MED ORDER — BUTORPHANOL TARTRATE 1 MG/ML IJ SOLN
1.0000 mg | INTRAMUSCULAR | Status: DC | PRN
  Administered 2019-01-16: 1 mg via INTRAVENOUS
  Filled 2019-01-16: qty 1

## 2019-01-16 NOTE — Progress Notes (Signed)
Very comfortable with epidural Afeb, VSS FHT- 120-130, mod variability, + accels, variable decels, Cat II, ctx not always tracing well VE-Rim/C/0, vtx Will recheck in 30 minutes and try pushing then

## 2019-01-16 NOTE — H&P (Signed)
Diana Diana is a 28 y.o. female, G3 P2002, EGA 39+ weeks with EDC 3-6 presenting for ctx.  On eval in MAU, having regular ctx, VE 6 cm dilated.  Prenatal care essentially uncomplicated.  OB History    Gravida  3   Para  2   Term  2   Preterm      AB      Living  2     SAB      TAB      Ectopic      Multiple  0   Live Births  2          Past Medical History:  Diagnosis Date  . Hx of chlamydia infection   . Hx of gonorrhea   . Medical history non-contributory   . SVD (spontaneous vaginal delivery) 08/28/2014  . SVD (spontaneous vaginal delivery) 03/01/2018   Past Surgical History:  Procedure Laterality Date  . INCISE AND DRAIN ABCESS  2005   located on the right buttocks  . NO PAST SURGERIES     Family History: family history includes Hypertension in her mother and paternal grandmother. Social History:  reports that she has been smoking cigarettes. She has been smoking about 4.00 packs per day. She quit smokeless tobacco use about 5 years ago. She reports previous alcohol use. She reports that she does not use drugs.     Maternal Diabetes: No Genetic Screening: Normal Maternal Ultrasounds/Referrals: Normal Fetal Ultrasounds or other Referrals:  None Maternal Substance Abuse:  No Significant Maternal Medications:  None Significant Maternal Lab Results:  Lab values include: Group B Strep positive Other Comments:  None  Review of Systems  Respiratory: Negative.   Cardiovascular: Negative.    Maternal Medical History:  Reason for admission: Contractions.   Contractions: Frequency: regular.   Perceived severity is strong.    Fetal activity: Perceived fetal activity is normal.    Prenatal complications: no prenatal complications Prenatal Complications - Diabetes: none.    Dilation: 8.5 Effacement (%): 90 Station: -1 Exam by:: Mabeline Caras, RN Blood pressure 94/61, pulse 67, temperature 98.2 F (36.8 C), temperature source Axillary, resp. rate  16, last menstrual period 04/14/2018, unknown if currently breastfeeding. Maternal Exam:  Uterine Assessment: Contraction strength is moderate.  Contraction frequency is regular.   Abdomen: Patient reports no abdominal tenderness. Estimated fetal weight is 7.5 lbs.   Fetal presentation: vertex  Introitus: Normal vulva. Normal vagina.  Amniotic fluid character: not assessed.  Pelvis: adequate for delivery.   Cervix: Cervix evaluated by digital exam.     Fetal Exam Fetal Monitor Review: Mode: ultrasound.   Baseline rate: 130-140.  Variability: moderate (6-25 bpm).   Pattern: accelerations present, variable decelerations and early decelerations.    Fetal State Assessment: Category II - tracings are indeterminate.     Physical Exam  Vitals reviewed. Constitutional: She appears well-developed and well-nourished.  Cardiovascular: Normal rate and regular rhythm.  Respiratory: Effort normal and breath sounds normal.  GI: Soft.  Genitourinary:    Vulva normal.     Prenatal labs: ABO, Rh: --/--/B POS (03/03 1550) Antibody: NEG (03/03 1550) Rubella: Immune (09/09 0000) RPR: Nonreactive (09/09 0000)  HBsAg: Negative (09/09 0000)  HIV: Non-reactive (09/09 0000)  GBS: Positive (02/14 0000)   Assessment/Plan: IUP at 39+ weeks in active labor, +GBS.  Admitted, received Ampicillin, will AROM 4 hours after ampicillin unless delivers before then   Blane Ohara Calixto Pavel 01/16/2019, 5:11 PM

## 2019-01-16 NOTE — Anesthesia Pain Management Evaluation Note (Signed)
  CRNA Pain Management Visit Note  Patient: Diana Clark, 28 y.o., female  "Hello I am a member of the anesthesia team at [facility] and [employer]. We have an anesthesia team available at all times to provide care throughout the hospital, including epidural management and anesthesia for C-section. I don't know your plan for the delivery whether it a natural birth, water birth, IV sedation, nitrous supplementation, doula or epidural, but we want to meet your pain goals."   1.Was your pain managed to your expectations on prior hospitalizations?   Yes   2.What is your expectation for pain management during this hospitalization?     Epidural  3.How can we help you reach that goal? unsure  Record the patient's initial score and the patient's pain goal.   Pain: 0  Pain Goal: 10 [facility] wants you to be able to say your pain was always managed very well.  Casimer Lanius 01/16/2019

## 2019-01-16 NOTE — Anesthesia Preprocedure Evaluation (Signed)
Anesthesia Evaluation  Patient identified by MRN, date of birth, ID band Patient awake    Reviewed: Allergy & Precautions, Patient's Chart, lab work & pertinent test results  Airway Mallampati: II  TM Distance: >3 FB Neck ROM: Full    Dental no notable dental hx. (+) Teeth Intact   Pulmonary Current Smoker,    Pulmonary exam normal breath sounds clear to auscultation       Cardiovascular negative cardio ROS Normal cardiovascular exam Rhythm:Regular Rate:Normal     Neuro/Psych negative neurological ROS  negative psych ROS   GI/Hepatic Neg liver ROS, GERD  ,  Endo/Other  Obesity  Renal/GU negative Renal ROS  negative genitourinary   Musculoskeletal negative musculoskeletal ROS (+)   Abdominal (+) + obese,   Peds  Hematology  (+) anemia ,   Anesthesia Other Findings   Reproductive/Obstetrics (+) Pregnancy Hx/o STD's                             Anesthesia Physical Anesthesia Plan  ASA: II  Anesthesia Plan: Epidural   Post-op Pain Management:    Induction:   PONV Risk Score and Plan:   Airway Management Planned: Natural Airway  Additional Equipment:   Intra-op Plan:   Post-operative Plan:   Informed Consent: I have reviewed the patients History and Physical, chart, labs and discussed the procedure including the risks, benefits and alternatives for the proposed anesthesia with the patient or authorized representative who has indicated his/her understanding and acceptance.       Plan Discussed with: Anesthesiologist  Anesthesia Plan Comments:         Anesthesia Quick Evaluation

## 2019-01-16 NOTE — MAU Provider Note (Signed)
CC: No chief complaint on file.    First Provider Initiated Contact with Patient 01/16/2019 at 1548.   HPI: Diana Clark is a 28 y.o. year old G18P2002 female at [redacted]w[redacted]d weeks gestation in active labor. CNM was call to assist pt getting out of car.   Associated Sx:  Vaginal bleeding: Denies Leaking of fluid: Unsure Fetal movement: Nml  Get [facility]. Denies pregnancy complications.   O: No data found.  General: Severe distress Heart: Regular rate Lungs: Normal effort Abd: Soft, NT, Gravid, S=D Pelvic: NEFG, Neg LOF, Neg blood.  Dilation: 6 Effacement (%): 70 Cervical Position: Middle Station: -1 Presentation: Vertex Exam by:: Manya Silvas, CNM  EFM: 145,  Toco: Contractions every 2 minutes, strong  A: [redacted]w[redacted]d week IUP Active labor  P: Admit to L&D  Dr. Willis Modena to assume care of pt. RN to give report.   Tamala Julian, [place], [place] 01/16/2019 3:54 PM

## 2019-01-16 NOTE — MAU Note (Signed)
Pt presents to MAU with ctx and has had bloody show, pt denies LOF. Pt was checked in triage 6 cm Marlou Porch, CNM)

## 2019-01-16 NOTE — Anesthesia Procedure Notes (Signed)
Epidural Patient location during procedure: OB Start time: 01/16/2019 4:25 PM End time: 01/16/2019 4:36 PM  Staffing Anesthesiologist: Josephine Igo, MD Performed: anesthesiologist   Preanesthetic Checklist Completed: patient identified, site marked, surgical consent, pre-op evaluation, timeout performed, IV checked, risks and benefits discussed and monitors and equipment checked  Epidural Patient position: sitting Prep: site prepped and draped and DuraPrep Patient monitoring: continuous pulse ox and blood pressure Approach: midline Location: L3-L4 Injection technique: LOR air  Needle:  Needle type: Tuohy  Needle gauge: 17 G Needle length: 9 cm and 9 Needle insertion depth: 7 cm Catheter type: closed end flexible Catheter size: 19 Gauge Catheter at skin depth: 12 cm Test dose: negative and Other  Assessment Events: blood not aspirated, injection not painful, no injection resistance, negative IV test and no paresthesia  Additional Notes Patient identified. Risks and benefits discussed including failed block, incomplete  Pain control, post dural puncture headache, nerve damage, paralysis, blood pressure Changes, nausea, vomiting, reactions to medications-both toxic and allergic and post Partum back pain. All questions were answered. Patient expressed understanding and wished to proceed. Sterile technique was used throughout procedure. Epidural site was Dressed with sterile barrier dressing. No paresthesias, signs of intravascular injection Or signs of intrathecal spread were encountered.  Patient was more comfortable after the epidural was dosed. Please see RN's note for documentation of vital signs and FHR which are stable. Reason for block:procedure for pain

## 2019-01-17 ENCOUNTER — Inpatient Hospital Stay (HOSPITAL_COMMUNITY): Payer: Medicaid Other

## 2019-01-17 LAB — RPR: RPR Ser Ql: NONREACTIVE

## 2019-01-17 MED ORDER — SENNOSIDES-DOCUSATE SODIUM 8.6-50 MG PO TABS
2.0000 | ORAL_TABLET | ORAL | Status: DC
  Administered 2019-01-17 – 2019-01-18 (×2): 2 via ORAL
  Filled 2019-01-17 (×2): qty 2

## 2019-01-17 MED ORDER — ACETAMINOPHEN 325 MG PO TABS
650.0000 mg | ORAL_TABLET | ORAL | Status: DC | PRN
  Administered 2019-01-17 – 2019-01-18 (×7): 650 mg via ORAL
  Filled 2019-01-17 (×8): qty 2

## 2019-01-17 MED ORDER — OXYCODONE HCL 5 MG PO TABS: 10.0000 mg | ORAL_TABLET | ORAL | Status: DC | PRN

## 2019-01-17 MED ORDER — METHYLERGONOVINE MALEATE 0.2 MG PO TABS: 0.2000 mg | ORAL_TABLET | ORAL | Status: DC | PRN

## 2019-01-17 MED ORDER — MEASLES, MUMPS & RUBELLA VAC IJ SOLR: 0.5000 mL | Freq: Once | INTRAMUSCULAR | Status: DC

## 2019-01-17 MED ORDER — DIPHENHYDRAMINE HCL 25 MG PO CAPS: 25.0000 mg | ORAL_CAPSULE | Freq: Four times a day (QID) | ORAL | Status: DC | PRN

## 2019-01-17 MED ORDER — METHYLERGONOVINE MALEATE 0.2 MG/ML IJ SOLN: 0.2000 mg | INTRAMUSCULAR | Status: DC | PRN

## 2019-01-17 MED ORDER — WITCH HAZEL-GLYCERIN EX PADS: 1.0000 "application " | MEDICATED_PAD | CUTANEOUS | Status: DC | PRN

## 2019-01-17 MED ORDER — ONDANSETRON HCL 4 MG/2ML IJ SOLN: 4.0000 mg | INTRAMUSCULAR | Status: DC | PRN

## 2019-01-17 MED ORDER — SIMETHICONE 80 MG PO CHEW: 80.0000 mg | CHEWABLE_TABLET | ORAL | Status: DC | PRN

## 2019-01-17 MED ORDER — IBUPROFEN 600 MG PO TABS
600.0000 mg | ORAL_TABLET | Freq: Four times a day (QID) | ORAL | Status: DC
  Administered 2019-01-17 – 2019-01-18 (×7): 600 mg via ORAL
  Filled 2019-01-17 (×7): qty 1

## 2019-01-17 MED ORDER — TETANUS-DIPHTH-ACELL PERTUSSIS 5-2.5-18.5 LF-MCG/0.5 IM SUSP: 0.5000 mL | Freq: Once | INTRAMUSCULAR | Status: DC

## 2019-01-17 MED ORDER — ZOLPIDEM TARTRATE 5 MG PO TABS: 5.0000 mg | ORAL_TABLET | Freq: Every evening | ORAL | Status: DC | PRN

## 2019-01-17 MED ORDER — COCONUT OIL OIL: 1.0000 "application " | TOPICAL_OIL | Status: DC | PRN

## 2019-01-17 MED ORDER — OXYCODONE HCL 5 MG PO TABS: 5.0000 mg | ORAL_TABLET | ORAL | Status: DC | PRN

## 2019-01-17 MED ORDER — ONDANSETRON HCL 4 MG PO TABS: 4.0000 mg | ORAL_TABLET | ORAL | Status: DC | PRN

## 2019-01-17 MED ORDER — DIBUCAINE 1 % RE OINT: 1.0000 "application " | TOPICAL_OINTMENT | RECTAL | Status: DC | PRN

## 2019-01-17 MED ORDER — BENZOCAINE-MENTHOL 20-0.5 % EX AERO: 1.0000 "application " | INHALATION_SPRAY | CUTANEOUS | Status: DC | PRN

## 2019-01-17 MED ORDER — PRENATAL MULTIVITAMIN CH
1.0000 | ORAL_TABLET | Freq: Every day | ORAL | Status: DC
  Administered 2019-01-17 – 2019-01-18 (×2): 1 via ORAL
  Filled 2019-01-17 (×2): qty 1

## 2019-01-17 MED ORDER — MAGNESIUM HYDROXIDE 400 MG/5ML PO SUSP: 30.0000 mL | ORAL | Status: DC | PRN

## 2019-01-17 NOTE — Progress Notes (Signed)
Post Partum Day 1 Subjective: no complaints, up ad lib, voiding, tolerating PO and nl lochia, pain controlled  Objective: Blood pressure (!) 93/59, pulse 77, temperature 97.8 F (36.6 C), temperature source Oral, resp. rate 16, last menstrual period 04/14/2018, unknown if currently breastfeeding.  Physical Exam:  General: alert and no distress Lochia: appropriate Uterine Fundus: firm   Recent Labs    01/16/19 1559  HGB 11.4*  HCT 34.8*    Assessment/Plan: Plan for discharge tomorrow, Breastfeeding and Lactation consult.  Routine PP care   LOS: 1 day   Diana Clark 01/17/2019, 8:47 AM

## 2019-01-17 NOTE — Anesthesia Postprocedure Evaluation (Signed)
Anesthesia Post Note  Patient: Diana Clark  Procedure(s) Performed: AN AD HOC LABOR EPIDURAL     Patient location during evaluation: Mother Baby Anesthesia Type: Epidural Level of consciousness: awake, awake and alert, oriented and patient cooperative Pain management: pain level controlled Vital Signs Assessment: post-procedure vital signs reviewed and stable Respiratory status: spontaneous breathing, nonlabored ventilation and respiratory function stable Cardiovascular status: stable Postop Assessment: no headache, no backache, patient able to bend at knees and no apparent nausea or vomiting Anesthetic complications: no    Last Vitals:  Vitals:   01/17/19 0051 01/17/19 0500  BP: 120/77 (!) 93/59  Pulse: 91 77  Resp: 17 16  Temp: 36.6 C 36.6 C    Last Pain:  Vitals:   01/17/19 0500  TempSrc: Oral  PainSc: 8    Pain Goal:                   Whitni Pasquini L

## 2019-01-18 MED ORDER — IBUPROFEN 600 MG PO TABS: 600.0000 mg | ORAL_TABLET | Freq: Four times a day (QID) | ORAL | 1 refills | Status: DC | PRN

## 2019-01-18 MED ORDER — PRENATAL MULTIVITAMIN CH: 1.0000 | ORAL_TABLET | Freq: Every day | ORAL | 1 refills | Status: AC

## 2019-01-18 NOTE — Progress Notes (Signed)
Patient ID: Diana Clark, female   DOB: 1991/07/11, 28 y.o.   MRN: [id number] Pt doing well with no complaints. She reports pain well controlled with meds. Ambulating , voiding and tol po well. Bonding well with baby - breast/bottle feeding. No CP, SOB, HA or n/v. Desires to nest today as baby not cleared for discharge yet ( did not receive two doses antibx for GBS treatment) VSS ABD - FF and below umbilicus EXT - no homans  A/P: PPD#2 s/p svd - stable         Reviewed discharge instructions         Schedule circumcision in office within two weeks and postpartum visit in 6 weeks

## 2019-01-18 NOTE — Discharge Instructions (Signed)
Caring for Your Premature Infant at Home A premature infant is a baby that is born early, before 59 weeks of pregnancy. Babies who are born early are more likely to develop certain problems and complications. Because of this, they may need extra care at home. What kinds of problems is my infant at risk for? Babies who are born early are at risk for certain problems, including:  Breathing problems.  Low birth weight.  Feeding problems.  Sleeping problems.  Yellowing of the skin (jaundice).  Infections such as pneumonia. The earlier your baby is born, the more likely he or she is to have these problems. Babies born very early are at risk for more serious problems, including:  Severe breathing problems.  Eyesight problems.  Brain development concerns.  Behavioral and emotional development concerns.  Growth and developmental delays.  Cerebral palsy.  Severe feeding problems, or problems passing stool. Follow these instructions at home: Caring for your infant  Prior to going home with your premature infant, understand your infant's conditions and needs.  Follow all your baby's health care provider's instructions for providing support and care to your preterm infant.  Bond with your infant as much as possible by holding, rocking, and cuddling. This can be skin to skin contact.  Keep your infant warm. Dress your infant in layers and keep him or her away from drafts, especially in cold months of the year. Safety   Consider learning infant CPR.  When driving, monitor your infant in the car seat until he or she grows and matures. It is important to do this because preterm infants may have problems with their airway when in an infant car seat. A small rolled diaper or blanket between the crotch strap and the infant may be added to help keep them in a safe position.  Place your baby to sleep on his or her back unless your baby's health care provider has told you not to do so. This  is the best and most important way you can lower the risk of sudden infant death syndrome (SIDS).  Monitor your infant when he or she is feeding for any changes in skin color or problems breathing. Premature infants may have problems coordinating sucking, swallowing, and breathing. General instructions   Wash your hands thoroughly after going to the bathroom or changing a diaper. Preterm infants may be more prone to infection. Use soap and water or hand sanitizer if soap and water are not available.  Consider joining a support group in order to get help from organizations and groups that understand your challenges.  Keep all follow-up visits as told by your childs health care provider. This is important. Where to find more information  March of Dimes: www.marchofdimes.com  Prematurity.org: www.prematurity.org Contact a health care provider if:  Your infant has trouble feeding.  Your infant has trouble sleeping.  Your infant develops jaundice.  Your infant shows signs of infection, such as a fever or yellow or green nasal mucus.  You are not able to console your baby when he or she cries. Get help right away if:  Your infant has a bluish color to his or her skin.  Your infant has trouble breathing.  Your infant who is younger than 3 months has a temperature of 100F (38C) or higher. Summary  A premature infant is a baby that is born early, before 37 weeks of pregnancy.  Babies who are born early are more likely to develop certain problems and complications.  Prior to  going home with your premature infant, understand your infant's conditions and needs.  Get support from organizations and groups that understand your challenges. Consider joining a support group. This information is not intended to replace advice given to you by your health care provider. Make sure you discuss any questions you have with your health care provider. Document Released: 01/22/2004 Document  Revised: 12/29/2016 Document Reviewed: 12/29/2016 Elsevier Interactive Patient Education  2019 [employer].

## 2019-01-18 NOTE — Discharge Summary (Signed)
OB Discharge Summary     Patient Name: Diana Clark DOB: 06/30/1991 MRN: [id number]  Date of admission: 01/16/2019 Delivering MD: Willis Modena, TODD   Date of discharge: 01/18/2019  Admitting diagnosis: ctx Intrauterine pregnancy: [redacted]w[redacted]d     Secondary diagnosis:  Active Problems:   Indication for care in labor or delivery  Additional problems: none     Discharge diagnosis: Term Pregnancy Delivered                                                                                                Post partum procedures:none  Augmentation: none  Complications: None  Hospital course:  Onset of Labor With Vaginal Delivery     28 y.o. yo G3P3003 at [redacted]w[redacted]d was admitted in Active Labor on 01/16/2019. Patient had an uncomplicated labor course as follows:  Membrane Rupture Time/Date: 5:44 PM ,01/16/2019   Intrapartum Procedures: Episiotomy: None [1]                                         Lacerations:  None [1]  Patient had a delivery of a Viable infant. 01/16/2019  Information for the patient's newborn:  Leighanne, Adolph [[id number]]  Delivery Method: Vaginal, Spontaneous(Filed from Delivery Summary)    Pateint had an uncomplicated postpartum course.  She is ambulating, tolerating a regular diet, passing flatus, and urinating well. Patient is discharged home in stable condition on 01/18/19.   Physical exam  Vitals:   01/17/19 0930 01/17/19 1315 01/17/19 2217 01/18/19 0431  BP: 120/73 102/65 106/79 118/71  Pulse: 73 76 86 69  Resp: 18 16 18 17   Temp: 97.6 F (36.4 C) 97.6 F (36.4 C) 97.8 F (36.6 C) 97.8 F (36.6 C)  TempSrc: Oral Oral Oral Oral   General: alert, cooperative and no distress Lochia: appropriate Uterine Fundus: firm Incision: N/A DVT Evaluation: No evidence of DVT seen on physical exam. Labs: Lab Results  Component Value Date   WBC 11.2 (H) 01/16/2019   HGB 11.4 (L) 01/16/2019   HCT 34.8 (L) 01/16/2019   MCV 86.6 01/16/2019   PLT 279 01/16/2019   No flowsheet  data found.  Discharge instruction: per After Visit Summary and "Baby and Me Booklet".  After visit meds:  Allergies as of 01/18/2019   No Known Allergies     Medication List    TAKE these medications   ibuprofen 600 MG tablet Commonly known as:  ADVIL,MOTRIN Take 1 tablet (600 mg total) by mouth every 6 (six) hours as needed.   prenatal multivitamin Tabs tablet Take 1 tablet by mouth daily at 12 noon.       Diet: routine diet  Activity: Advance as tolerated. Pelvic rest for 6 weeks.   Outpatient follow LD:[id number] circumcision in office within two weeks and postpartum visit in 6 weeks Follow up Appt:No future appointments. Follow up Visit:No follow-ups on file.  Postpartum contraception: Not Discussed  Newborn Data: Live born female  Birth Weight: 7 lb 8.1 oz (3405 g) APGAR: 9,  84  Newborn Delivery   Birth date/time:  01/16/2019 20:57:00 Delivery type:  Vaginal, Spontaneous     Baby Feeding: Bottle and Breast Disposition:rooming in   01/18/2019 Isaiah Serge, DO

## 2020-06-04 ENCOUNTER — Encounter (HOSPITAL_COMMUNITY): Payer: Self-pay

## 2020-06-04 ENCOUNTER — Other Ambulatory Visit: Payer: Self-pay

## 2020-06-04 ENCOUNTER — Other Ambulatory Visit: Payer: Medicaid Other

## 2020-06-04 ENCOUNTER — Ambulatory Visit (HOSPITAL_COMMUNITY)
Admission: EM | Admit: 2020-06-04 | Discharge: 2020-06-04 | Disposition: A | Discharge status: Home / Self Care | Payer: Medicaid Other | Attending: Family Medicine | Admitting: Family Medicine

## 2020-06-04 DIAGNOSIS — L089 Local infection of the skin and subcutaneous tissue, unspecified: Secondary | ICD-10-CM | POA: Diagnosis not present

## 2020-06-04 DIAGNOSIS — L720 Epidermal cyst: Secondary | ICD-10-CM

## 2020-06-04 MED ORDER — IBUPROFEN 800 MG PO TABS: 800.0000 mg | ORAL_TABLET | Freq: Three times a day (TID) | ORAL | 0 refills | Status: DC

## 2020-06-04 MED ORDER — DOXYCYCLINE HYCLATE 100 MG PO CAPS: 100.0000 mg | ORAL_CAPSULE | Freq: Two times a day (BID) | ORAL | 0 refills | Status: DC

## 2020-06-04 NOTE — ED Triage Notes (Signed)
Pt presents to UC for cyst on back x4 years. Pt sates it is now irritiated. Pt sates site is red and painful. Pt also endorsing discharge at site. Pt denies OTC treatments or relieving factors. PT denies fever, chills, body aches, n/v/d.

## 2020-06-04 NOTE — ED Provider Notes (Signed)
[place]   [id number] 06/04/20 Arrival Time: 5621  ASSESSMENT & PLAN:  1. Infected epidermoid cyst     Incision and Drainage Procedure Note  Anesthesia: 1% plain lidocaine  Procedure Details  The procedure, risks and complications have been discussed in detail (including, but not limited to pain and bleeding) with the patient.  The skin induration was prepped and draped in the usual fashion. After adequate local anesthesia, I&D with a #11 blade was performed on the midline upper back with copious bloody, cystic, purulent drainage.  EBL: minimal Drains: none Packing: placed Condition: Tolerated procedure well Complications: none.  Meds ordered this encounter  Medications   ibuprofen (ADVIL) 800 MG tablet    Sig: Take 1 tablet (800 mg total) by mouth 3 (three) times daily with meals.    Dispense:  21 tablet    Refill:  0   doxycycline (VIBRAMYCIN) 100 MG capsule    Sig: Take 1 capsule (100 mg total) by mouth 2 (two) times daily.    Dispense:  14 capsule    Refill:  0    Wound care instructions discussed and given in written format. To return in 48 hours for wound check.  Finish all antibiotics. OTC analgesics as needed.  Reviewed expectations re: course of current medical issues. Questions answered. Outlined signs and symptoms indicating need for more acute intervention. Patient verbalized understanding. After Visit Summary given.   SUBJECTIVE:  Diana Clark is a 29 y.o. female who presents with a "cyst on my back"; unsure of how long present; recently inflammed and painful; some drainage today. Afebrile.   OBJECTIVE:  Vitals:   06/04/20 1732  BP: 122/68  Pulse: 72  Resp: 16  Temp: 98.2 F (36.8 C)  TempSrc: Oral  SpO2: 100%     General appearance: alert; no distress Skin: approx 2x2 cm induration of her upper mid back with overlying skin erythema; tender to touch; slight purulent drainage Psychological: alert and cooperative; normal  mood and affect  No Known Allergies  Past Medical History:  Diagnosis Date   Hx of chlamydia infection    Hx of gonorrhea    Medical history non-contributory    SVD (spontaneous vaginal delivery) 08/28/2014   SVD (spontaneous vaginal delivery) 03/01/2018   Social History   Socioeconomic History   Marital status: Single    Spouse name: Not on file   Number of children: Not on file   Years of education: Not on file   Highest education level: Not on file  Occupational History   Not on file  Tobacco Use   Smoking status: Current Every Day Smoker    Packs/day: 4.00    Types: Cigarettes   Smokeless tobacco: Former Systems developer    Quit date: 01/05/2014  Vaping Use   Vaping Use: Never used  Substance and Sexual Activity   Alcohol use: Not Currently   Drug use: No   Sexual activity: Yes    Birth control/protection: None    Comment: pregnant  Other Topics Concern   Not on file  Social History Narrative   Not on file   Social Determinants of Health   Financial Resource Strain:    Difficulty of Paying Living Expenses:   Food Insecurity:    Worried About Estate manager/land agent of Food in the Last Year:    Arboriculturist in the Last Year:   Transportation Needs:    Film/video editor (Medical):    Lack of Transportation (Non-Medical):   Physical  Activity:    Days of Exercise per Week:    Minutes of Exercise per Session:   Stress:    Feeling of Stress :   Social Connections:    Frequency of Communication with Friends and Family:    Frequency of Social Gatherings with Friends and Family:    Attends Religious Services:    Active Member of Clubs or Organizations:    Attends Music therapist:    Marital Status:    Family History  Problem Relation Age of Onset   Hypertension Mother    Hypertension Paternal Grandmother    Past Surgical History:  Procedure Laterality Date   INCISE AND DRAIN ABCESS  2005   located on the right buttocks     NO PAST SURGERIES             Vanessa Kick, MD 06/04/20 [phone]

## 2020-06-06 ENCOUNTER — Encounter (HOSPITAL_COMMUNITY): Payer: Self-pay

## 2020-06-06 ENCOUNTER — Ambulatory Visit (HOSPITAL_COMMUNITY)
Admission: EM | Admit: 2020-06-06 | Discharge: 2020-06-06 | Disposition: A | Discharge status: Home / Self Care | Payer: Medicaid Other | Attending: Family Medicine | Admitting: Family Medicine

## 2020-06-06 ENCOUNTER — Other Ambulatory Visit: Payer: Self-pay

## 2020-06-06 DIAGNOSIS — Z4800 Encounter for change or removal of nonsurgical wound dressing: Secondary | ICD-10-CM

## 2020-06-06 NOTE — ED Triage Notes (Signed)
Pt presents to have packing removed from abscess wound on upper back from a few days ago.

## 2020-06-06 NOTE — ED Provider Notes (Signed)
[facility]    CSN: [id number] Arrival date & time: 06/06/20  1224      History   Chief Complaint Chief Complaint  Patient presents with   Packing Removal    HPI AERITH CANAL is a 29 y.o. female.   Molli Hazard presents for follow up and packing removal s/p I&D of infected cyst. Was here 7/21 with I&D, started on doxycycline. Cyst had been present for longer before infection present. Pain has been improving since seen here last. No fevers. Hasn't changed the dressing, no known drainage outside of what is contained within dressing. Tolerating antibiotic prescribed.    ROS per HPI, negative if not otherwise mentioned.      Past Medical History:  Diagnosis Date   Hx of chlamydia infection    Hx of gonorrhea    Medical history non-contributory    SVD (spontaneous vaginal delivery) 08/28/2014   SVD (spontaneous vaginal delivery) 03/01/2018    Patient Active Problem List   Diagnosis Date Noted   Indication for care in labor or delivery 01/16/2019   False labor after 37 weeks of gestation without delivery 01/09/2019   SVD (spontaneous vaginal delivery) 03/01/2018   Normal pregnancy in multigravida in third trimester 02/28/2018   Active labor at term 08/27/2014    Past Surgical History:  Procedure Laterality Date   INCISE AND DRAIN ABCESS  2005   located on the right buttocks   NO PAST SURGERIES      OB History    Gravida  3   Para  3   Term  3   Preterm      AB      Living  3     SAB      TAB      Ectopic      Multiple  0   Live Births  3            Home Medications    Prior to Admission medications   Medication Sig Start Date End Date Taking? Authorizing Provider  doxycycline (VIBRAMYCIN) 100 MG capsule Take 1 capsule (100 mg total) by mouth 2 (two) times daily. 06/04/20   Vanessa Kick, MD  ibuprofen (ADVIL) 800 MG tablet Take 1 tablet (800 mg total) by mouth 3 (three) times daily with meals. 06/04/20   Vanessa Kick, MD  Prenatal Vit-Fe Fumarate-FA (PRENATAL MULTIVITAMIN) TABS tablet Take 1 tablet by mouth daily at 12 noon. 01/18/19   Sherlyn Hay, DO    Family History Family History  Problem Relation Age of Onset   Hypertension Mother    Hypertension Paternal Grandmother     Social History Social History   Tobacco Use   Smoking status: Current Every Day Smoker    Packs/day: 4.00    Types: Cigarettes   Smokeless tobacco: Former Systems developer    Quit date: 01/05/2014  Vaping Use   Vaping Use: Never used  Substance Use Topics   Alcohol use: Not Currently   Drug use: No     Allergies   Patient has no known allergies.   Review of Systems Review of Systems   Physical Exam Triage Vital Signs ED Triage Vitals  Enc Vitals Group     BP 06/06/20 1401 113/72     Pulse Rate 06/06/20 1401 73     Resp 06/06/20 1401 18     Temp 06/06/20 1401 98.4 F (36.9 C)     Temp Source 06/06/20 1401 Oral  SpO2 06/06/20 1401 100 %     Weight --      Height --      Head Circumference --      Peak Flow --      Pain Score 06/06/20 1400 4     Pain Loc --      Pain Edu? --      Excl. in [place]? --    No data found.  Updated Vital Signs BP 113/72 (BP Location: Right Arm)    Pulse 73    Temp 98.4 F (36.9 C) (Oral)    Resp 18    LMP 06/04/2020 (Exact Date)    SpO2 100%   Visual Acuity Right Eye Distance:   Left Eye Distance:   Bilateral Distance:    Right Eye Near:   Left Eye Near:    Bilateral Near:     Physical Exam Constitutional:      General: She is not in acute distress.    Appearance: She is well-developed.  Cardiovascular:     Rate and Rhythm: Normal rate.  Pulmonary:     Effort: Pulmonary effort is normal.  Skin:    General: Skin is warm and dry.       Neurological:     Mental Status: She is alert and oriented to person, place, and time.      UC Treatments / Results  Labs (all labs ordered are listed, but only abnormal results are displayed) Labs  Reviewed - No data to display  EKG   Radiology No results found.  Procedures Procedures (including critical care time)  Medications Ordered in UC Medications - No data to display  Initial Impression / Assessment and Plan / UC Course  I have reviewed the triage vital signs and the nursing notes.  Pertinent labs & imaging results that were available during my care of the patient were reviewed by me and considered in my medical decision making (see chart for details).     Tolerating antibiotics. No further drainage from cyst following I&D. Packing removed, not replaced. Dressing placed. Discussed that may need complete removal of cyst once infection has resolved and incision healed. Return precautions provided. Patient verbalized understanding and agreeable to plan.   Final Clinical Impressions(s) / UC Diagnoses   Final diagnoses:  Abscess packing removal     Discharge Instructions     Cleanse area daily with soap and water.  Keep covered to keep protected and collect drainage.   Warm compresses to promote drainage, especially over the next 48 hours.  Complete antibiotics as previously prescribed.  May need complete cyst removal in the future, and you may complete this with dermatology or through our [facility]'s dermatology clinic   ED Prescriptions    None     PDMP not reviewed this encounter.   Zigmund Gottron, NP 06/06/20 1504

## 2020-06-06 NOTE — Discharge Instructions (Signed)
Cleanse area daily with soap and water.  Keep covered to keep protected and collect drainage.   Warm compresses to promote drainage, especially over the next 48 hours.  Complete antibiotics as previously prescribed.  May need complete cyst removal in the future, and you may complete this with dermatology or through our [facility]'s dermatology clinic

## 2020-12-18 ENCOUNTER — Ambulatory Visit: Payer: Medicaid Other | Admitting: Obstetrics & Gynecology

## 2021-01-07 ENCOUNTER — Ambulatory Visit (INDEPENDENT_AMBULATORY_CARE_PROVIDER_SITE_OTHER): Payer: Medicaid Other | Admitting: Obstetrics

## 2021-01-07 ENCOUNTER — Encounter: Payer: Self-pay | Admitting: Obstetrics

## 2021-01-07 ENCOUNTER — Other Ambulatory Visit: Payer: Self-pay

## 2021-01-07 ENCOUNTER — Other Ambulatory Visit (HOSPITAL_COMMUNITY)
Admission: RE | Admit: 2021-01-07 | Discharge: 2021-01-07 | Disposition: A | Discharge status: Home / Self Care | Payer: Medicaid Other | Source: Ambulatory Visit | Attending: Obstetrics | Admitting: Obstetrics

## 2021-01-07 VITALS — BP 121/77 | HR 83 | Ht 64.0 in | Wt 195.0 lb

## 2021-01-07 DIAGNOSIS — L0293 Carbuncle, unspecified: Secondary | ICD-10-CM

## 2021-01-07 DIAGNOSIS — Z01419 Encounter for gynecological examination (general) (routine) without abnormal findings: Secondary | ICD-10-CM | POA: Diagnosis not present

## 2021-01-07 DIAGNOSIS — N898 Other specified noninflammatory disorders of vagina: Secondary | ICD-10-CM | POA: Diagnosis not present

## 2021-01-07 DIAGNOSIS — B9689 Other specified bacterial agents as the cause of diseases classified elsewhere: Secondary | ICD-10-CM

## 2021-01-07 DIAGNOSIS — N760 Acute vaginitis: Secondary | ICD-10-CM

## 2021-01-07 DIAGNOSIS — F17200 Nicotine dependence, unspecified, uncomplicated: Secondary | ICD-10-CM

## 2021-01-07 DIAGNOSIS — Z3009 Encounter for other general counseling and advice on contraception: Secondary | ICD-10-CM

## 2021-01-07 MED ORDER — METRONIDAZOLE 500 MG PO TABS: 500.0000 mg | ORAL_TABLET | Freq: Two times a day (BID) | ORAL | 2 refills | Status: DC

## 2021-01-07 MED ORDER — CLINDAMYCIN HCL 300 MG PO CAPS: 300.0000 mg | ORAL_CAPSULE | Freq: Three times a day (TID) | ORAL | 2 refills | Status: DC

## 2021-01-07 NOTE — Progress Notes (Signed)
New GYN presents for AEX/PAP.   C/o recurrent BV, boils in groins x 1 year.

## 2021-01-07 NOTE — Progress Notes (Signed)
Subjective:        Diana Clark is a 30 y.o. female here for a routine exam.  Current complaints: Malodorous vaginal discharge.  Recurrent boils in groin and inner thigh areas.  Personal health questionnaire:  Is patient Ashkenazi Jewish, have a family history of breast and/or ovarian cancer: no Is there a family history of uterine cancer diagnosed at age < 45, gastrointestinal cancer, urinary tract cancer, family member who is a Field seismologist syndrome-associated carrier: no Is the patient overweight and hypertensive, family history of diabetes, personal history of gestational diabetes, preeclampsia or PCOS: no Is patient over 45, have PCOS,  family history of premature CHD under age 40, diabetes, smoke, have hypertension or peripheral artery disease:  no At any time, has a partner hit, kicked or otherwise hurt or frightened you?: no Over the past 2 weeks, have you felt down, depressed or hopeless?: no Over the past 2 weeks, have you felt little interest or pleasure in doing things?:no   Gynecologic History Patient's last menstrual period was 12/18/2020 (exact date). Contraception: none Last Pap: ~ 2018. Results were: normal Last mammogram: n/a. Results were: n/a  Obstetric History OB History  Gravida Para Term Preterm AB Living  3 3 3     3   SAB IAB Ectopic Multiple Live Births        0 3    # Outcome Date GA Lbr Len/2nd Weight Sex Delivery Anes PTL Lv  3 Term 01/16/19 [redacted]w[redacted]d 05:43 / 00:14 7 lb 8.1 oz (3.405 kg) M Vag-Spont EPI  LIV     Birth Comments: WNL  2 Term 03/01/18 [redacted]w[redacted]d 03:14 / 00:24 7 lb 2.3 oz (3.24 kg) F Vag-Spont EPI  LIV  1 Term 08/28/14 [redacted]w[redacted]d 16:20 / 00:20 8 lb 5.2 oz (3.776 kg) M Vag-Spont EPI  LIV    Past Medical History:  Diagnosis Date  . Hx of chlamydia infection   . Hx of gonorrhea   . Medical history non-contributory   . SVD (spontaneous vaginal delivery) 08/28/2014  . SVD (spontaneous vaginal delivery) 03/01/2018    Past Surgical History:  Procedure  Laterality Date  . INCISE AND DRAIN ABCESS  2005   located on the right buttocks  . NO PAST SURGERIES       Current Outpatient Medications:  .  clindamycin (CLEOCIN) 300 MG capsule, Take 1 capsule (300 mg total) by mouth 3 (three) times daily., Disp: 21 capsule, Rfl: 2 .  ibuprofen (ADVIL) 800 MG tablet, Take 1 tablet (800 mg total) by mouth 3 (three) times daily with meals. (Patient not taking: Reported on 01/07/2021), Disp: 21 tablet, Rfl: 0 .  Prenatal Vit-Fe Fumarate-FA (PRENATAL MULTIVITAMIN) TABS tablet, Take 1 tablet by mouth daily at 12 noon. (Patient not taking: Reported on 01/07/2021), Disp: 90 tablet, Rfl: 1 No Known Allergies  Social History   Tobacco Use  . Smoking status: Current Every Day Smoker    Packs/day: 4.00    Types: Cigarettes  . Smokeless tobacco: Former Systems developer    Quit date: 01/05/2014  Substance Use Topics  . Alcohol use: Not Currently    Family History  Problem Relation Age of Onset  . Hypertension Mother   . Hypertension Paternal Grandmother       Review of Systems  Constitutional: negative for fatigue and weight loss Respiratory: negative for cough and wheezing Cardiovascular: negative for chest pain, fatigue and palpitations Gastrointestinal: negative for abdominal pain and change in bowel habits Musculoskeletal:negative for myalgias Neurological: negative for gait  problems and tremors Behavioral/Psych: negative for abusive relationship, depression Endocrine: negative for temperature intolerance    Genitourinary:negative for abnormal menstrual periods, genital lesions, hot flashes, sexual problems.  Positive for malodorous vaginal discharge Integument/breast: positive for recurrent boils. negative for breast lump, breast tenderness, nipple discharge and skin lesion(s)    Objective:       BP 121/77   Pulse 83   Ht 5\' 4"  (1.626 m)   Wt 195 lb (88.5 kg)   LMP 12/18/2020 (Exact Date)   BMI 33.47 kg/m  General:   alert and no distress  Skin:    boils in groin and inner thigh  Lungs:   clear to auscultation bilaterally  Heart:   regular rate and rhythm, S1, S2 normal, no murmur, click, rub or gallop  Breasts:   normal without suspicious masses, skin or nipple changes or axillary nodes  Abdomen:  normal findings: no organomegaly, soft, non-tender and no hernia  Pelvis:  External genitalia: normal general appearance Urinary system: urethral meatus normal and bladder without fullness, nontender Vaginal: normal without tenderness, induration or masses Cervix: normal appearance Adnexa: normal bimanual exam Uterus: anteverted and non-tender, normal size   Lab Review Urine pregnancy test Labs reviewed yes Radiologic studies reviewed no  50% of 20 min visit spent on counseling and coordination of care.   Assessment:     1. Encounter for gynecological examination with Papanicolaou smear of cervix Rx: - Cytology - PAP( [facility])  2. Vaginal discharge Rx: - Cervicovaginal ancillary only( [facility])  3. Tobacco dependence - cessation recommended with the aid of medication and behavioral modification  4. Encounter for counseling regarding contraception - declines hormonal contraception - condoms recommended for contraception and prevention of STD's  5. BV (bacterial vaginosis) - metronidazole Rx to be taken after clindamycin if BV still present (clindamycin has activity against BV)  6. Recurrent boils Rx: - Ambulatory referral to Dermatology - clindamycin (CLEOCIN) 300 MG capsule; Take 1 capsule (300 mg total) by mouth 3 (three) times daily.  Dispense: 21 capsule; Refill: 2    Plan:    Education reviewed: calcium supplements, depression evaluation, low fat, low cholesterol diet, safe sex/STD prevention, self breast exams, smoking cessation and weight bearing exercise. Contraception: condoms. Follow up in: 1 year.   Meds ordered this encounter  Medications  . DISCONTD: metroNIDAZOLE (FLAGYL) 500 MG tablet    Sig:  Take 1 tablet (500 mg total) by mouth 2 (two) times daily.    Dispense:  14 tablet    Refill:  2  . clindamycin (CLEOCIN) 300 MG capsule    Sig: Take 1 capsule (300 mg total) by mouth 3 (three) times daily.    Dispense:  21 capsule    Refill:  2    Shelly Bombard, MD 01/07/2021 4:10 PM

## 2021-01-07 NOTE — Patient Instructions (Signed)
Bacterial Vaginosis  Bacterial vaginosis is an infection of the vagina. It happens when too many normal germs (healthy bacteria) grow in the vagina. This infection can make it easier to get other infections from sex (STIs). It is very important for pregnant women to get treated. This infection can cause babies to be born early or at a low birth weight. What are the causes? This infection is caused by an increase in certain germs that grow in the vagina. You cannot get this infection from toilet seats, bedsheets, swimming pools, or things that touch your vagina. What increases the risk?  Having sex with a new person or more than one person.  Having sex without protection.  Douching.  Having an intrauterine device (IUD).  Smoking.  Using drugs or drinking alcohol. These can lead you to do things that are risky.  Taking certain antibiotic medicines.  Being pregnant. What are the signs or symptoms? Some women have no symptoms. Symptoms may include:  A discharge from your vagina. It may be gray or white. It can be watery or foamy.  A fishy smell. This can happen after sex or during your menstrual period.  Itching in and around your vagina.  A feeling of burning or pain when you pee (urinate). How is this treated? This infection is treated with antibiotic medicines. These may be given to you as:  A pill.  A cream for your vagina.  A medicine that you put into your vagina (suppository). If the infection comes back after treatment, you may need more antibiotics. Follow these instructions at home: Medicines  Take over-the-counter and prescription medicines as told by your doctor.  Take or use your antibiotic medicine as told by your doctor. Do not stop taking or using it, even if you start to feel better. General instructions  If the person you have sex with is a woman, tell her that you have this infection. She will need to follow up with her doctor. If you have a female  partner, he does not need to be treated.  Do not have sex until you finish treatment.  Drink enough fluid to keep your pee pale yellow.  Keep your vagina and butt clean. ? Wash the area with warm water each day. ? Wipe from front to back after you use the toilet.  If you are breastfeeding a baby, ask your doctor if you should keep doing so during treatment.  Keep all follow-up visits. How is this prevented? Self-care  Do not douche.  Use only warm water to wash around your vagina.  Wear underwear that is cotton or lined with cotton.  Do not wear tight pants and pantyhose, especially in the summer. Safe sex  Use protection when you have sex. This includes: ? Use condoms. ? Use dental dams. This is a thin layer that protects the mouth during oral sex.  Limit how many people you have sex with. To prevent this infection, it is best to have sex with just one person.  Get tested for STIs. The person you have sex with should also get tested. Drugs and alcohol  Do not smoke or use any products that contain nicotine or tobacco. If you need help quitting, ask your doctor.  Do not use drugs.  Do not drink alcohol if: ? Your doctor tells you not to drink. ? You are pregnant, may be pregnant, or are planning to become pregnant.  If you drink alcohol: ? Limit how much you have to 0-1 drink   a day. ? Know how much alcohol is in your drink. In the [place] one drink equals one 12 oz bottle of beer (355 mL), one 5 oz glass of wine (148 mL), or one 1 oz glass of hard liquor (44 mL). Where to find more information  Centers for Disease Control and Prevention: www.cdc.gov  American Sexual Health Association: www.ashastd.org  Office on Women's Health: www.womenshealth.gov Contact a doctor if:  Your symptoms do not get better, even after you are treated.  You have more discharge or pain when you pee.  You have a fever or chills.  You have pain in your belly (abdomen) or in the area  between your hips.  You have pain with sex.  You bleed from your vagina between menstrual periods. Summary  This infection can happen when too many germs (bacteria) grow in the vagina.  This infection can make it easier to get infections from sex (STIs). Treating this can lower that chance.  Get treated if you are pregnant. This infection can cause babies to be born early.  Do not stop taking or using your antibiotic medicine, even if you start to feel better. This information is not intended to replace advice given to you by your health care provider. Make sure you discuss any questions you have with your health care provider. Document Revised: 05/01/2020 Document Reviewed: 05/01/2020 Elsevier Patient Education  2021 Elsevier Inc.  

## 2021-01-09 LAB — CERVICOVAGINAL ANCILLARY ONLY
Bacterial Vaginitis (gardnerella): POSITIVE — AB
Candida Glabrata: NEGATIVE
Candida Vaginitis: NEGATIVE
Chlamydia: NEGATIVE
Comment: NEGATIVE
Comment: NEGATIVE
Comment: NEGATIVE
Comment: NEGATIVE
Comment: NEGATIVE
Comment: NORMAL
Neisseria Gonorrhea: NEGATIVE
Trichomonas: NEGATIVE

## 2021-01-09 LAB — CYTOLOGY - PAP
Comment: NEGATIVE
Diagnosis: NEGATIVE
High risk HPV: NEGATIVE

## 2021-01-12 ENCOUNTER — Other Ambulatory Visit: Payer: Self-pay | Admitting: Obstetrics

## 2021-03-06 ENCOUNTER — Other Ambulatory Visit: Payer: Self-pay | Admitting: *Deleted

## 2021-03-06 MED ORDER — FLUCONAZOLE 150 MG PO TABS: 150.0000 mg | ORAL_TABLET | Freq: Once | ORAL | 0 refills | Status: DC

## 2021-03-06 NOTE — Progress Notes (Signed)
Pt called to office stating she has yeast inf after antibiotic use, would like treatment.  Diflucan sent per protocol.

## 2021-11-06 ENCOUNTER — Other Ambulatory Visit: Payer: Self-pay

## 2021-11-06 ENCOUNTER — Emergency Department (HOSPITAL_COMMUNITY)
Admission: EM | Admit: 2021-11-06 | Discharge: 2021-11-06 | Disposition: A | Discharge status: Home / Self Care | Payer: Medicaid Other | Attending: Emergency Medicine | Admitting: Emergency Medicine

## 2021-11-06 ENCOUNTER — Emergency Department (HOSPITAL_COMMUNITY): Payer: Medicaid Other

## 2021-11-06 DIAGNOSIS — Z5321 Procedure and treatment not carried out due to patient leaving prior to being seen by health care provider: Secondary | ICD-10-CM | POA: Insufficient documentation

## 2021-11-06 DIAGNOSIS — W19XXXA Unspecified fall, initial encounter: Secondary | ICD-10-CM | POA: Insufficient documentation

## 2021-11-06 DIAGNOSIS — M25571 Pain in right ankle and joints of right foot: Secondary | ICD-10-CM | POA: Insufficient documentation

## 2021-11-06 NOTE — ED Notes (Signed)
Pt seen leaving in a car. Pt was called for vitals, no answer.

## 2021-11-06 NOTE — ED Provider Notes (Signed)
Emergency Medicine Provider Triage Evaluation Note  SHEILLA MARIS , a 30 y.o. female  was evaluated in triage.  Pt complains of right ankle pain after tripping / spraining it last night. Pain 7/10. No numbness, tingling. Weight bearing with difficulty.  Review of Systems  Positive: Right ankle pain Negative: Numbness, tingling  Physical Exam  BP 131/79 (BP Location: Left Arm)    Pulse 99    Temp 98.7 F (37.1 C) (Oral)    Resp 14    SpO2 98%  Gen:   Awake, no distress   Resp:  Normal effort  MSK:   Moves extremities without difficulty  Other:  TTP atfl distribution, intact DP/PT pulses  Medical Decision Making  Medically screening exam initiated at 5:21 PM.  Appropriate orders placed.  LACHAE HOHLER was informed that the remainder of the evaluation will be completed by another provider, this initial triage assessment does not replace that evaluation, and the importance of remaining in the ED until their evaluation is complete.  Ankle injury   Dorien Chihuahua 11/06/21 1722    Noemi Chapel, MD 11/06/21 2308

## 2021-11-06 NOTE — ED Triage Notes (Signed)
Pt here for R ankle pain after falling last night. Pt was trying to get groceries out of her car and had a mechanical fall. Denies any other pain, has some swelling to lateral R ankle, pt has used ice on it w/ minimal relief

## 2021-12-01 ENCOUNTER — Other Ambulatory Visit: Payer: Self-pay | Admitting: Obstetrics & Gynecology

## 2021-12-03 ENCOUNTER — Ambulatory Visit: Payer: Medicaid Other | Admitting: Obstetrics

## 2021-12-10 ENCOUNTER — Ambulatory Visit: Payer: Medicaid Other | Admitting: Obstetrics

## 2022-05-03 IMAGING — CR DG ANKLE COMPLETE 3+V*R*
3 series · 3 of 3 positions shown · non-contrast
Comparison: None.

CLINICAL DATA: Status post fall.

EXAM:
RIGHT ANKLE - COMPLETE 3+ VIEW

[ankle ap]
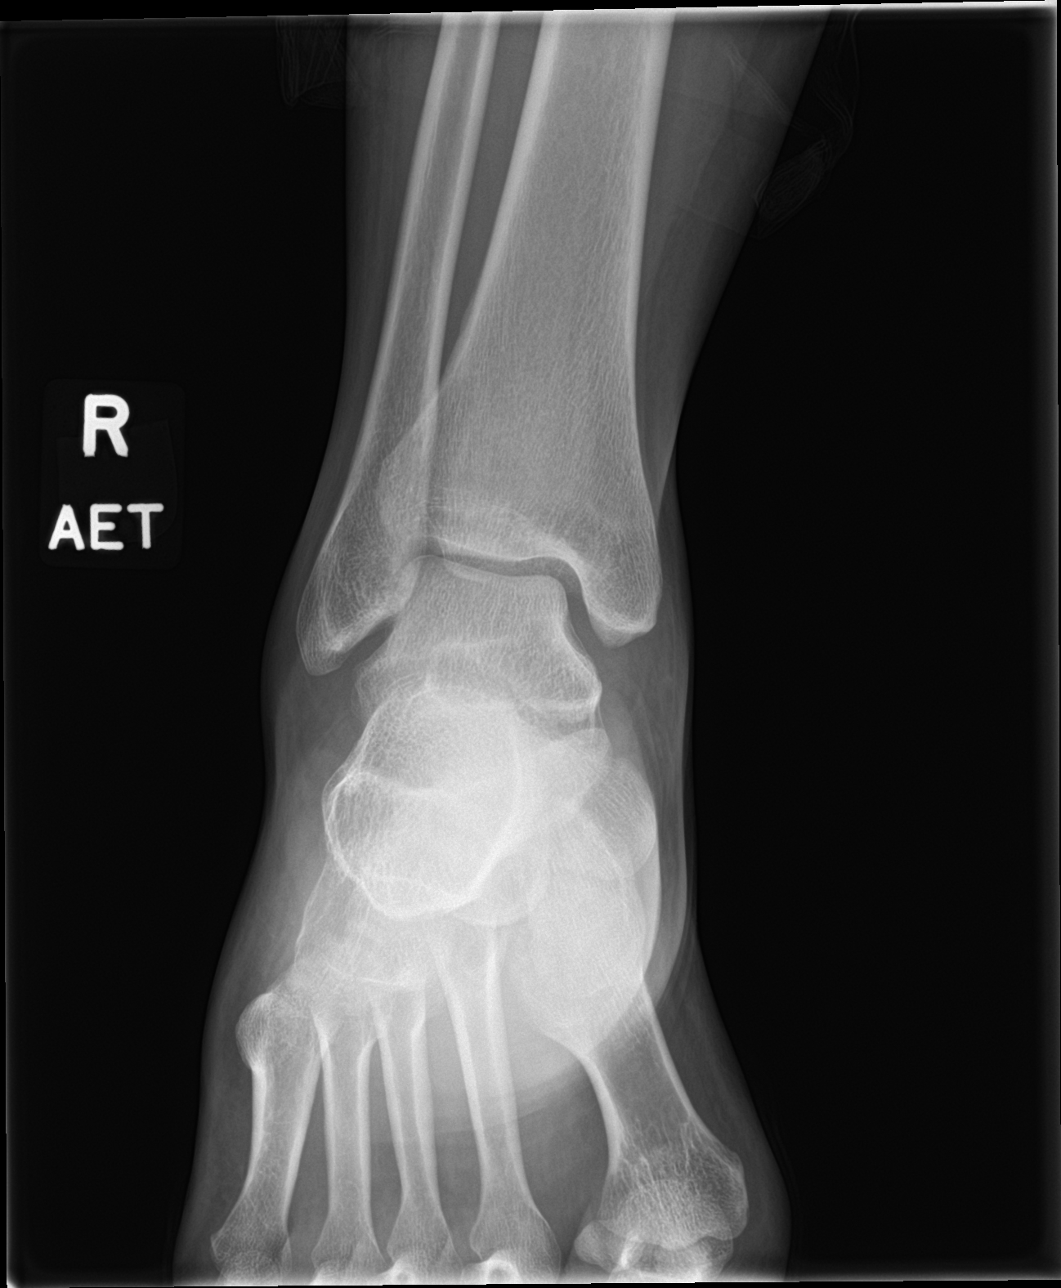

[ankle obl]
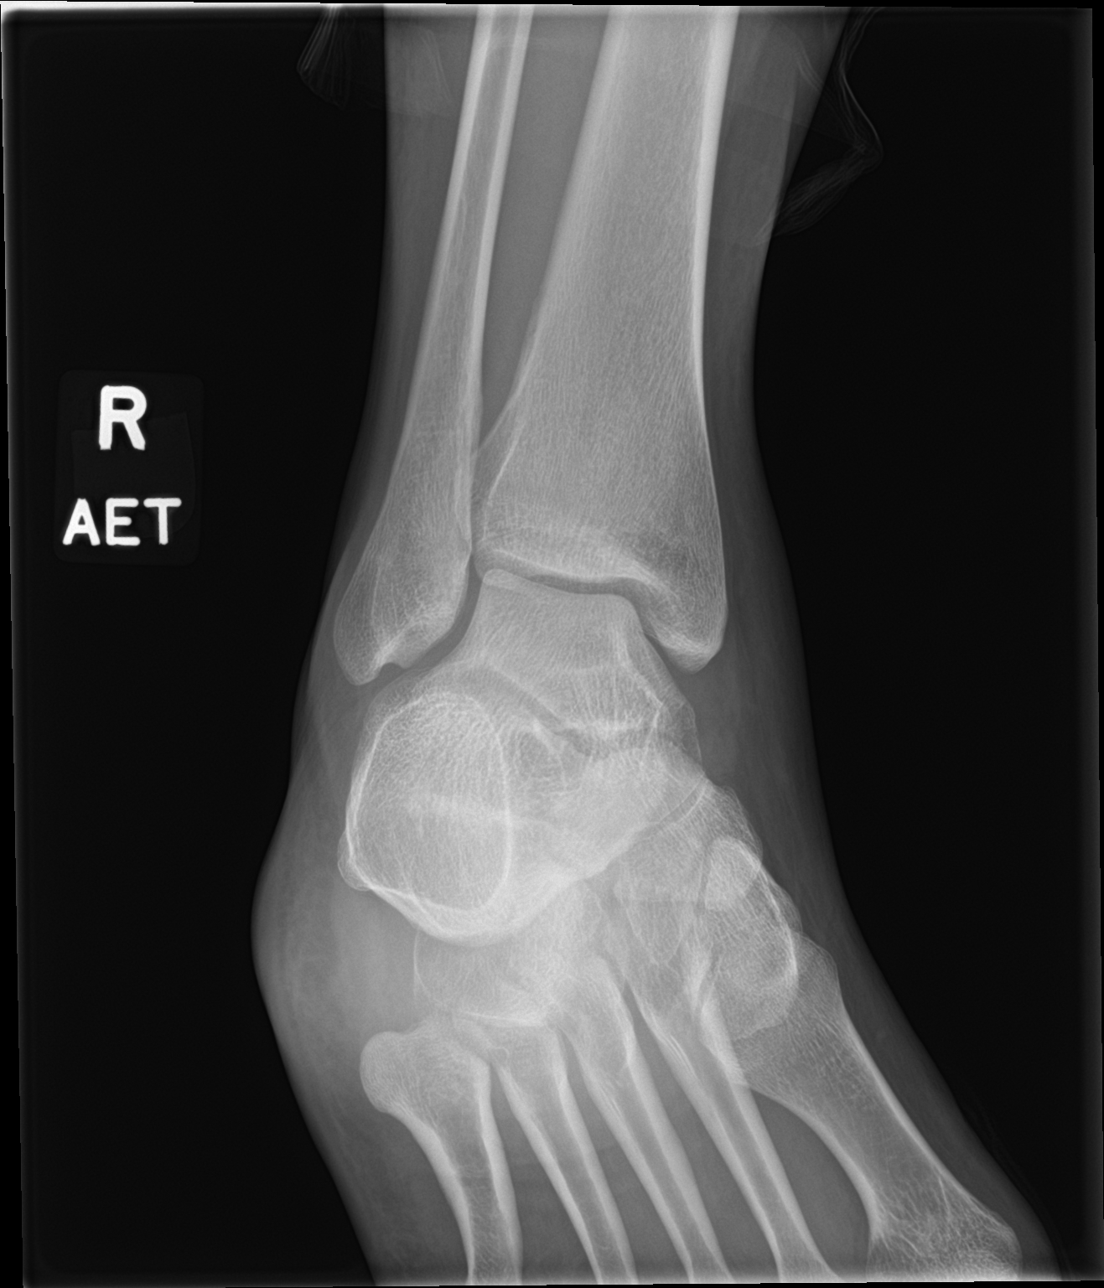

[ankle lat]
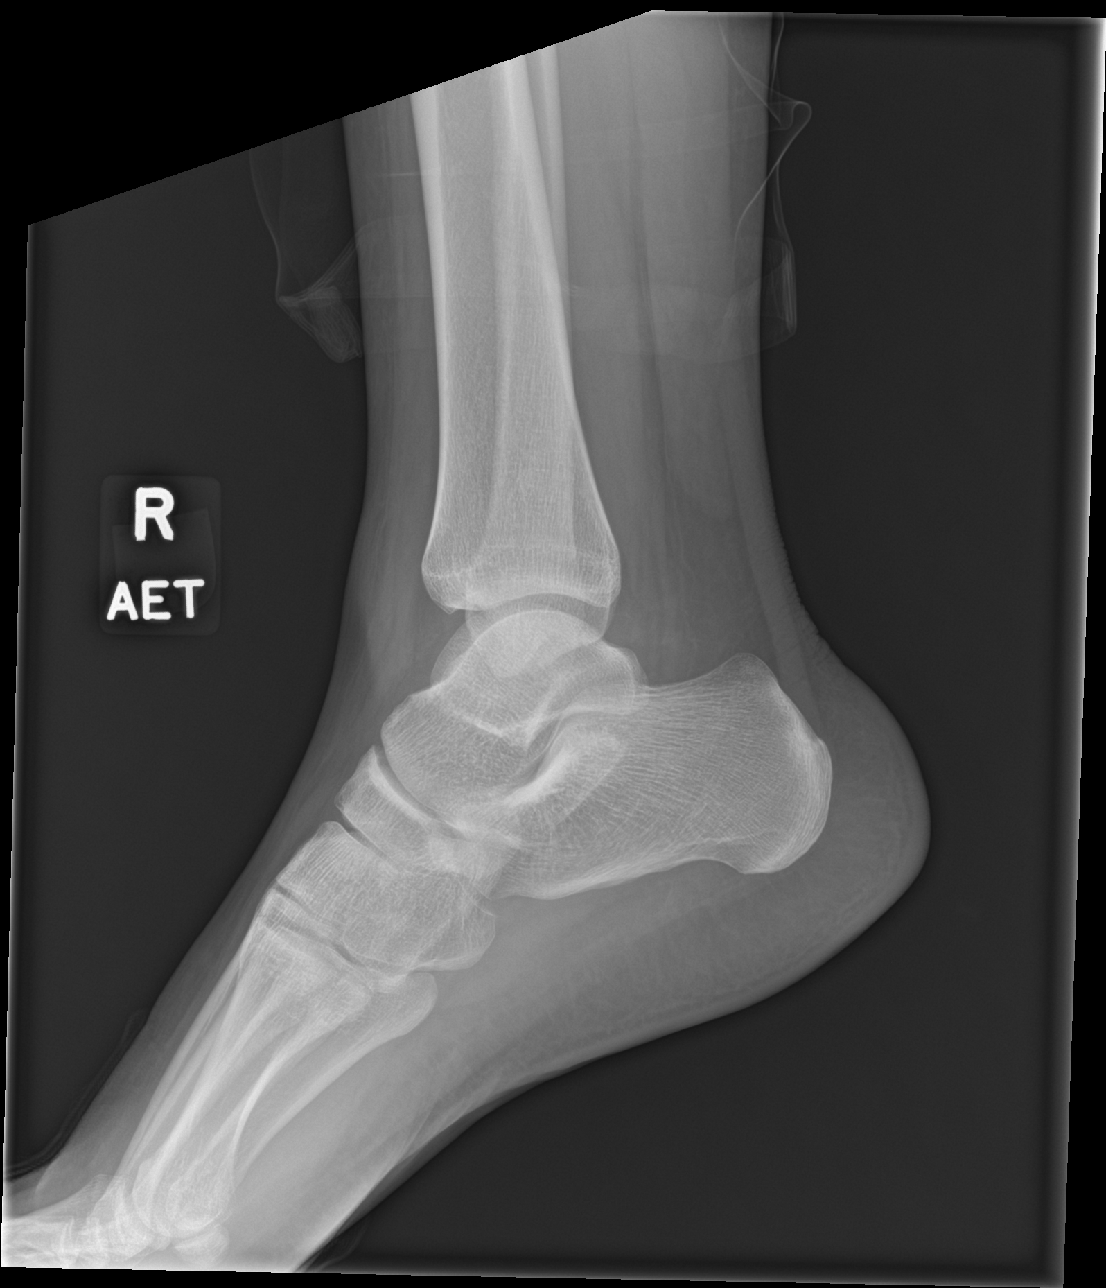

[3 of 3 positions shown; findings below may reference images not displayed]

FINDINGS: There is no evidence of fracture, dislocation, or joint effusion.
There is no evidence of arthropathy or other focal bone abnormality.
There is mild anterior soft tissue swelling.
IMPRESSION: Mild anterior soft tissue swelling without an acute osseous
abnormality.

## 2023-11-16 NOTE — L&D Delivery Note (Signed)
 Delivery Note At 2:54 AM a viable and healthy female was delivered via Vaginal, Spontaneous (Presentation:  left occiput anterior    ).  APGAR: 8, 9; weight pending .   Placenta status: Spontaneous, Intact.  Cord: 3 vessels with double nuchal cord that was tight.  This could not be reduced prior to delivery.  Upon arrival to labor and delivery patient spontaneously ruptured membranes for meconium stained fluid.  I was in [place] at this time and teaching service was on standby.  Upon arrival the patient was involuntarily pushing.  With 1 contraction the patient delivered the infant's head to crowning in the left occiput anterior presentation.  A tight double nuchal cord was noted.  This could not be reduced on the perineum.  An attempt was going to be made to ligate the nuchal cord on the perineum.  However, because the patient did not have any anesthesia for delivery she was moving up the bed away from the delivery table.  With next push she delivered the anterior and posterior shoulders followed by the body.  At this point the nuchal cord was reduced and the infant was passed to the maternal abdomen.  While on the abdomen the infant was bulb suctioned and stimulated.  With delivery of the infant thicker meconium stained fluid was noted.  Following approximately 1 minute delay the cord was clamped and cut.  The placenta was then delivered spontaneously, intact, with three-vessel cord.  No lacerations required repair.  All sponge, needle, instrument counts were correct.  Mom and baby are doing well following delivery.  Anesthesia: None Episiotomy: None Lacerations: None Suture Repair: Not applicable Est. Blood Loss (mL):  176  Mom to postpartum.  Baby to Couplet care / Skin to Skin.  Marjorie Gull 10/30/2024, 3:14 AM

## 2024-04-09 ENCOUNTER — Inpatient Hospital Stay (HOSPITAL_COMMUNITY)
Admission: AD | Admit: 2024-04-09 | Discharge: 2024-04-09 | Disposition: A | Discharge status: Home / Self Care | Attending: Obstetrics and Gynecology | Admitting: Obstetrics and Gynecology

## 2024-04-09 ENCOUNTER — Inpatient Hospital Stay (HOSPITAL_COMMUNITY)

## 2024-04-09 ENCOUNTER — Encounter (HOSPITAL_COMMUNITY): Payer: Self-pay

## 2024-04-09 DIAGNOSIS — R109 Unspecified abdominal pain: Secondary | ICD-10-CM | POA: Diagnosis present

## 2024-04-09 DIAGNOSIS — Z3A11 11 weeks gestation of pregnancy: Secondary | ICD-10-CM | POA: Insufficient documentation

## 2024-04-09 DIAGNOSIS — O26899 Other specified pregnancy related conditions, unspecified trimester: Secondary | ICD-10-CM

## 2024-04-09 DIAGNOSIS — O99331 Smoking (tobacco) complicating pregnancy, first trimester: Secondary | ICD-10-CM | POA: Diagnosis not present

## 2024-04-09 DIAGNOSIS — O23591 Infection of other part of genital tract in pregnancy, first trimester: Secondary | ICD-10-CM | POA: Diagnosis not present

## 2024-04-09 DIAGNOSIS — F17210 Nicotine dependence, cigarettes, uncomplicated: Secondary | ICD-10-CM | POA: Diagnosis not present

## 2024-04-09 DIAGNOSIS — B9689 Other specified bacterial agents as the cause of diseases classified elsewhere: Secondary | ICD-10-CM | POA: Insufficient documentation

## 2024-04-09 LAB — COMPREHENSIVE METABOLIC PANEL WITH GFR
ALT: 23 U/L (ref 0–44)
AST: 20 U/L (ref 15–41)
Albumin: 3.2 g/dL — ABNORMAL LOW (ref 3.5–5.0)
Alkaline Phosphatase: 41 U/L (ref 38–126)
Anion gap: 10 (ref 5–15)
BUN: <5 mg/dL — ABNORMAL LOW (ref 6–20)
CO2: 22 mmol/L (ref 22–32)
Calcium: 9 mg/dL (ref 8.9–10.3)
Chloride: 104 mmol/L (ref 98–111)
Creatinine, Ser: 0.56 mg/dL (ref 0.44–1.00)
GFR, Estimated: >60 mL/min (ref >60)
Glucose, Bld: 99 mg/dL (ref 70–99)
Potassium: 3.4 mmol/L — ABNORMAL LOW (ref 3.5–5.1)
Sodium: 136 mmol/L (ref 135–145)
Total Bilirubin: 0.5 mg/dL (ref 0.0–1.2)
Total Protein: 6.7 g/dL (ref 6.5–8.1)

## 2024-04-09 LAB — URINALYSIS, ROUTINE W REFLEX MICROSCOPIC
Bilirubin Urine: NEGATIVE
Glucose, UA: NEGATIVE mg/dL
Hgb urine dipstick: NEGATIVE
Ketones, ur: NEGATIVE mg/dL
Leukocytes,Ua: NEGATIVE
Nitrite: NEGATIVE
Protein, ur: NEGATIVE mg/dL
Specific Gravity, Urine: 1.019 (ref 1.005–1.030)
pH: 7 (ref 5.0–8.0)

## 2024-04-09 LAB — CBC
HCT: 34.5 % — ABNORMAL LOW (ref 36.0–46.0)
Hemoglobin: 12.1 g/dL (ref 12.0–15.0)
MCH: 31.6 pg (ref 26.0–34.0)
MCHC: 35.1 g/dL (ref 30.0–36.0)
MCV: 90.1 fL (ref 80.0–100.0)
Platelets: 295 10*3/uL (ref 150–400)
RBC: 3.83 MIL/uL — ABNORMAL LOW (ref 3.87–5.11)
RDW: 12.8 % (ref 11.5–15.5)
WBC: 10.7 10*3/uL — ABNORMAL HIGH (ref 4.0–10.5)
nRBC: 0 % (ref 0.0–0.2)

## 2024-04-09 LAB — WET PREP, GENITAL
Sperm: NONE SEEN
Trich, Wet Prep: NONE SEEN
WBC, Wet Prep HPF POC: <10 (ref <10)
Yeast Wet Prep HPF POC: NONE SEEN

## 2024-04-09 LAB — HCG, QUANTITATIVE, PREGNANCY: hCG, Beta Chain, Quant, S: 64661 m[IU]/mL — ABNORMAL HIGH (ref <5)

## 2024-04-09 MED ORDER — CYCLOBENZAPRINE HCL 5 MG PO TABS
5.0000 mg | ORAL_TABLET | Freq: Once | ORAL | Status: DC
  Filled 2024-04-09: qty 1

## 2024-04-09 MED ORDER — METRONIDAZOLE 0.75 % VA GEL: 1.0000 | Freq: Every day | VAGINAL | 1 refills | Status: DC

## 2024-04-09 MED ORDER — ACETAMINOPHEN 325 MG PO TABS
650.0000 mg | ORAL_TABLET | Freq: Once | ORAL | Status: AC
  Administered 2024-04-09: 650 mg via ORAL
  Filled 2024-04-09: qty 2

## 2024-04-09 NOTE — Discharge Instructions (Signed)
   [facility] [employer] [facility] [employer] at [facility]  [address], [place], [place] [address]  [phone]  Center for [facility] at [facility]  [address], [place], [place] [address]  [phone]  Center for [facility] Healthcare at [facility] [address], [place], [place] [address]  [phone]  Center for [facility] at [facility] [address], [place], [place] [address] [phone]  Center for [facility] at [facility]  [address] [place], [place] [address]  [phone]  Center for [facility] at [facility]  [address] (First floor), [place], [place] [address]  [phone]  Center for [facility] at [facility]  [address] [place], [place], [place] [address]  [phone]  [facility]  [address], [place], [place] [address]  [phone]  [facility]  [address] [place], [place], [place] [address]  [phone]  [facility]  [address] [place], [place] [address]  [phone]  [facility]  [address] [place], [place] [address]  [phone]  [facility]  [address], [place], [place] [address]  [phone]  [facility]   [address] [place], [place] [address]  [phone]  [facility]  [address] [place], [place] [address]   [phone]  [facility] [address], [place], [place] [address] [phone]  [facility]  [address] [place], [place] [address]  [phone]

## 2024-04-09 NOTE — MAU Note (Signed)
..  Diana Clark is a 33 y.o. at [facility] here in MAU reporting: Friday started having lower abdominal pressure that she feels more when she is walking. Is also having intermittent (every other day) cramping since the beginning of May. Took Tylenol  on Saturday for the pain, and it did not help.  Denies vaginal bleeding.  Had a positive pregnancy test in April. Has not scheduled her first prenatal appointment.  LMP: 01/19/2024 Onset of complaint: refer to note  Pain score: 5/10 Vitals:   04/09/24 1939  BP: 129/72  Pulse: 93  Resp: 16  Temp: 97.9 F (36.6 C)  SpO2: 100%     FHT:n/a Lab orders placed from triage:  POCT pregnancy test

## 2024-04-09 NOTE — MAU Provider Note (Signed)
 History     CSN: [id number]  Arrival date and time: 04/09/24 4782   Event Date/Time   First Provider Initiated Contact with Patient 04/09/24 2148      Chief Complaint  Patient presents with   Abdominal Pain    Diana Clark is a 33 y.o. [id number] at [redacted]w[redacted]d by Definite LMP of January 19, 2024 who has not established care.  She presents today for abdominal cramping.  She reports it has been intermittent since May.  She reports no relieving or worsening factors, but states she took tylenol  with no relief. She denies vaginal concerns or issues with urination, constipation, or diarrhea. She rates the pain a 5-6/10.    OB History     Gravida  4   Para  3   Term  3   Preterm      AB      Living  3      SAB      IAB      Ectopic      Multiple  0   Live Births  3           Past Medical History:  Diagnosis Date   Hx of chlamydia infection    Hx of gonorrhea    Medical history non-contributory    SVD (spontaneous vaginal delivery) 08/28/2014   SVD (spontaneous vaginal delivery) 03/01/2018    Past Surgical History:  Procedure Laterality Date   INCISE AND DRAIN ABCESS  2005   located on the right buttocks   NO PAST SURGERIES      Family History  Problem Relation Age of Onset   Hypertension Mother    Hypertension Paternal Grandmother     Social History   Tobacco Use   Smoking status: Every Day    Current packs/day: 4.00    Types: Cigarettes   Smokeless tobacco: Former    Quit date: 01/05/2014  Vaping Use   Vaping status: Never Used  Substance Use Topics   Alcohol use: Not Currently   Drug use: No    Allergies: No Known Allergies  Medications Prior to Admission  Medication Sig Dispense Refill Last Dose/Taking   clindamycin  (CLEOCIN ) 300 MG capsule Take 1 capsule (300 mg total) by mouth 3 (three) times daily. 21 capsule 2    fluconazole  (DIFLUCAN ) 150 MG tablet TAKE ONE TABLET BY MOUTH AS A ONE-TIME DOSE 1 tablet 0    ibuprofen  (ADVIL ) 800 MG tablet  Take 1 tablet (800 mg total) by mouth 3 (three) times daily with meals. (Patient not taking: Reported on 01/07/2021) 21 tablet 0    Prenatal Vit-Fe Fumarate-FA (PRENATAL MULTIVITAMIN) TABS tablet Take 1 tablet by mouth daily at 12 noon. (Patient not taking: Reported on 01/07/2021) 90 tablet 1     Review of Systems  Gastrointestinal:  Positive for abdominal pain and nausea. Negative for vomiting.  Genitourinary:  Negative for difficulty urinating, dysuria, vaginal bleeding and vaginal discharge.   Physical Exam   Blood pressure 129/72, pulse 93, temperature 97.9 F (36.6 C), temperature source Oral, resp. rate 16, height 5\' 4"  (1.626 m), weight 80 kg, last menstrual period 01/19/2024, SpO2 100%, unknown if currently breastfeeding.  Physical Exam Vitals and nursing note reviewed.  Constitutional:      General: She is not in acute distress.    Appearance: She is well-developed.  HENT:     Head: Normocephalic and atraumatic.  Eyes:     Conjunctiva/sclera: Conjunctivae normal.  Cardiovascular:     Rate  and Rhythm: Normal rate.  Pulmonary:     Effort: Pulmonary effort is normal. No respiratory distress.  Abdominal:     Palpations: Abdomen is soft.     Tenderness: There is abdominal tenderness in the right upper quadrant and right lower quadrant.  Musculoskeletal:     Cervical back: Normal range of motion.  Skin:    General: Skin is warm and dry.  Neurological:     Mental Status: She is alert and oriented to person, place, and time.  Psychiatric:        Mood and Affect: Mood normal.        Behavior: Behavior normal.     MAU Course  Procedures Results for orders placed or performed during the hospital encounter of 04/09/24 (from the past 24 hours)  Urinalysis, Routine w reflex microscopic -Urine, Clean Catch     Status: Abnormal   Collection Time: 04/09/24  8:32 PM  Result Value Ref Range   Color, Urine YELLOW YELLOW   APPearance CLOUDY (A) CLEAR   Specific Gravity, Urine 1.019  1.005 - 1.030   pH 7.0 5.0 - 8.0   Glucose, UA NEGATIVE NEGATIVE mg/dL   Hgb urine dipstick NEGATIVE NEGATIVE   Bilirubin Urine NEGATIVE NEGATIVE   Ketones, ur NEGATIVE NEGATIVE mg/dL   Protein, ur NEGATIVE NEGATIVE mg/dL   Nitrite NEGATIVE NEGATIVE   Leukocytes,Ua NEGATIVE NEGATIVE   RBC / HPF 0-5 0 - 5 RBC/hpf   WBC, UA 0-5 0 - 5 WBC/hpf   Bacteria, UA FEW (A) NONE SEEN   Squamous Epithelial / HPF 11-20 0 - 5 /HPF   Mucus PRESENT   CBC     Status: Abnormal   Collection Time: 04/09/24  8:38 PM  Result Value Ref Range   WBC 10.7 (H) 4.0 - 10.5 K/uL   RBC 3.83 (L) 3.87 - 5.11 MIL/uL   Hemoglobin 12.1 12.0 - 15.0 g/dL   HCT 96.2 (L) 95.2 - 84.1 %   MCV 90.1 80.0 - 100.0 fL   MCH 31.6 26.0 - 34.0 pg   MCHC 35.1 30.0 - 36.0 g/dL   RDW 32.4 40.1 - 02.7 %   Platelets 295 150 - 400 K/uL   nRBC 0.0 0.0 - 0.2 %  hCG, quantitative, pregnancy     Status: Abnormal   Collection Time: 04/09/24  8:38 PM  Result Value Ref Range   hCG, Beta Chain, Quant, S 64,661 (H) <5 mIU/mL  Comprehensive metabolic panel with GFR     Status: Abnormal   Collection Time: 04/09/24  8:38 PM  Result Value Ref Range   Sodium 136 135 - 145 mmol/L   Potassium 3.4 (L) 3.5 - 5.1 mmol/L   Chloride 104 98 - 111 mmol/L   CO2 22 22 - 32 mmol/L   Glucose, Bld 99 70 - 99 mg/dL   BUN <5 (L) 6 - 20 mg/dL   Creatinine, Ser 2.53 0.44 - 1.00 mg/dL   Calcium  9.0 8.9 - 10.3 mg/dL   Total Protein 6.7 6.5 - 8.1 g/dL   Albumin 3.2 (L) 3.5 - 5.0 g/dL   AST 20 15 - 41 U/L   ALT 23 0 - 44 U/L   Alkaline Phosphatase 41 38 - 126 U/L   Total Bilirubin 0.5 0.0 - 1.2 mg/dL   GFR, Estimated >66 >44 mL/min   Anion gap 10 5 - 15  Wet prep, genital     Status: Abnormal   Collection Time: 04/09/24  8:41 PM  Result Value Ref  Range   Yeast Wet Prep HPF POC NONE SEEN NONE SEEN   Trich, Wet Prep NONE SEEN NONE SEEN   Clue Cells Wet Prep HPF POC PRESENT (A) NONE SEEN   WBC, Wet Prep HPF POC <10 <10   Sperm NONE SEEN    US  OB Comp  Less 14 Wks Result Date: 04/09/2024 CLINICAL DATA:  Intermittent cramping. EXAM: OBSTETRIC <14 WK ULTRASOUND TECHNIQUE: Transabdominal ultrasound was performed for evaluation of the gestation as well as the maternal uterus and adnexal regions. COMPARISON:  May 26, 2018 FINDINGS: Intrauterine gestational sac: Single Yolk sac:  Visualized. Embryo:  Visualized. Cardiac Activity: Visualized. Heart Rate: 162 bpm CRL:   49.5 mm   11 w 5 d                  US  EDC: October 24, 2024 Subchorionic hemorrhage:  None visualized. Maternal uterus/adnexae: A 1.7 cm x 2.1 cm x 2.3 cm posterior heterogeneous uterine fibroid is seen. The right ovary measures 2.9 cm x 3.2 cm x 1.8 cm and is normal in appearance. The left ovary measures 2.6 cm x 3.5 cm x 1.7 cm and is normal in appearance. No pelvic free fluid is seen. IMPRESSION: Single, viable intrauterine pregnancy at approximately 11 weeks and 5 days gestation by ultrasound evaluation. Electronically Signed   By: Virgle Grime M.D.   On: 04/09/2024 21:12    MDM Physical Exam Cultures: Wet Prep and GC/CT Labs: UA, UPT, CBC, CMP, hCG Ultrasound Muscle Relaxant/Analgesic Prescription Assessment and Plan  33 year old, G4P3003  SIUP at 11.4 weeks Abdominal Cramping Bacterial Vaginosis  -Labs and US  ordered from triage. -Results as above.  -Reviewed findings with patient. -Informed EDD remains the same. -Discussed treatment of BV. Patient opts for metrogel . Rx sent.  -Encouraged to start prenatal care. -Precautions reviewed. -Encouraged to return to MAU if symptoms worsen or with the onset of new symptoms. -Discharged to home in stable condition.  Kraig Peru MSN, CNM Advanced Practice Provider, Center for [facility] 04/09/2024, 9:48 PM

## 2024-04-10 LAB — GC/CHLAMYDIA PROBE AMP (~~LOC~~) NOT AT ARMC
Chlamydia: NEGATIVE
Comment: NEGATIVE
Comment: NORMAL
Neisseria Gonorrhea: NEGATIVE

## 2024-04-10 LAB — POCT PREGNANCY, URINE: Preg Test, Ur: POSITIVE — AB

## 2024-05-14 LAB — HEPATITIS C ANTIBODY: HCV Ab: NEGATIVE

## 2024-05-14 LAB — OB RESULTS CONSOLE GC/CHLAMYDIA
Chlamydia: NEGATIVE
Neisseria Gonorrhea: NEGATIVE

## 2024-05-14 LAB — OB RESULTS CONSOLE HEPATITIS B SURFACE ANTIGEN: Hepatitis B Surface Ag: NEGATIVE

## 2024-05-14 LAB — OB RESULTS CONSOLE RUBELLA ANTIBODY, IGM: Rubella: IMMUNE

## 2024-05-14 LAB — OB RESULTS CONSOLE ANTIBODY SCREEN: Antibody Screen: NEGATIVE

## 2024-05-14 LAB — OB RESULTS CONSOLE RPR: RPR: NONREACTIVE

## 2024-05-14 LAB — OB RESULTS CONSOLE HIV ANTIBODY (ROUTINE TESTING): HIV: NONREACTIVE

## 2024-10-01 LAB — OB RESULTS CONSOLE GBS: GBS: NEGATIVE

## 2024-10-24 ENCOUNTER — Telehealth (HOSPITAL_COMMUNITY): Payer: Self-pay | Admitting: *Deleted

## 2024-10-24 NOTE — Telephone Encounter (Signed)
 Preadmission screen

## 2024-10-25 ENCOUNTER — Other Ambulatory Visit: Payer: Self-pay | Admitting: Student

## 2024-10-25 ENCOUNTER — Encounter (HOSPITAL_COMMUNITY): Payer: Self-pay

## 2024-10-26 ENCOUNTER — Encounter (HOSPITAL_COMMUNITY): Payer: Self-pay | Admitting: *Deleted

## 2024-10-26 ENCOUNTER — Telehealth (HOSPITAL_COMMUNITY): Payer: Self-pay | Admitting: *Deleted

## 2024-10-26 NOTE — Telephone Encounter (Signed)
 Preadmission screen

## 2024-10-30 ENCOUNTER — Other Ambulatory Visit: Payer: Self-pay

## 2024-10-30 ENCOUNTER — Inpatient Hospital Stay (HOSPITAL_COMMUNITY): Admitting: Anesthesiology

## 2024-10-30 ENCOUNTER — Inpatient Hospital Stay (HOSPITAL_COMMUNITY)
Admission: AD | Admit: 2024-10-30 | Discharge: 2024-10-31 | DRG: 797 | Disposition: A | Attending: Obstetrics and Gynecology | Admitting: Obstetrics and Gynecology

## 2024-10-30 ENCOUNTER — Encounter (HOSPITAL_COMMUNITY): Payer: Self-pay | Admitting: Student

## 2024-10-30 ENCOUNTER — Encounter (HOSPITAL_COMMUNITY): Payer: Self-pay | Admitting: Anesthesiology

## 2024-10-30 ENCOUNTER — Encounter (HOSPITAL_COMMUNITY): Admission: AD | Disposition: A | Payer: Self-pay | Source: Home / Self Care | Attending: Obstetrics and Gynecology

## 2024-10-30 DIAGNOSIS — Z8249 Family history of ischemic heart disease and other diseases of the circulatory system: Secondary | ICD-10-CM | POA: Diagnosis not present

## 2024-10-30 DIAGNOSIS — Z302 Encounter for sterilization: Secondary | ICD-10-CM

## 2024-10-30 DIAGNOSIS — O26893 Other specified pregnancy related conditions, third trimester: Secondary | ICD-10-CM | POA: Diagnosis present

## 2024-10-30 DIAGNOSIS — Z3A40 40 weeks gestation of pregnancy: Secondary | ICD-10-CM | POA: Diagnosis not present

## 2024-10-30 DIAGNOSIS — B3731 Acute candidiasis of vulva and vagina: Secondary | ICD-10-CM | POA: Diagnosis present

## 2024-10-30 DIAGNOSIS — F17210 Nicotine dependence, cigarettes, uncomplicated: Secondary | ICD-10-CM | POA: Diagnosis present

## 2024-10-30 DIAGNOSIS — O9882 Other maternal infectious and parasitic diseases complicating childbirth: Secondary | ICD-10-CM | POA: Diagnosis present

## 2024-10-30 DIAGNOSIS — O99334 Smoking (tobacco) complicating childbirth: Secondary | ICD-10-CM | POA: Diagnosis present

## 2024-10-30 HISTORY — PX: TUBAL LIGATION: SHX77

## 2024-10-30 LAB — CBC
HCT: 36.9 % (ref 36.0–46.0)
HCT: 33.3 % — ABNORMAL LOW (ref 36.0–46.0)
Hemoglobin: 12.5 g/dL (ref 12.0–15.0)
Hemoglobin: 11.3 g/dL — ABNORMAL LOW (ref 12.0–15.0)
MCH: 29.8 pg (ref 26.0–34.0)
MCH: 29.6 pg (ref 26.0–34.0)
MCHC: 33.9 g/dL (ref 30.0–36.0)
MCHC: 33.9 g/dL (ref 30.0–36.0)
MCV: 88.1 fL (ref 80.0–100.0)
MCV: 87.2 fL (ref 80.0–100.0)
Platelets: 323 K/uL (ref 150–400)
Platelets: 275 K/uL (ref 150–400)
RBC: 4.19 MIL/uL (ref 3.87–5.11)
RBC: 3.82 MIL/uL — ABNORMAL LOW (ref 3.87–5.11)
RDW: 14.3 % (ref 11.5–15.5)
RDW: 14.3 % (ref 11.5–15.5)
WBC: 12.1 K/uL — ABNORMAL HIGH (ref 4.0–10.5)
WBC: 13.6 K/uL — ABNORMAL HIGH (ref 4.0–10.5)
nRBC: 0 % (ref 0.0–0.2)
nRBC: 0 % (ref 0.0–0.2)

## 2024-10-30 LAB — SYPHILIS: RPR W/REFLEX TO RPR TITER AND TREPONEMAL ANTIBODIES, TRADITIONAL SCREENING AND DIAGNOSIS ALGORITHM: RPR Ser Ql: NONREACTIVE

## 2024-10-30 LAB — TYPE AND SCREEN
ABO/RH(D): B POS
Antibody Screen: NEGATIVE

## 2024-10-30 SURGERY — LIGATION, FALLOPIAN TUBE, POSTPARTUM
Anesthesia: General

## 2024-10-30 MED ORDER — OXYCODONE HCL 5 MG/5ML PO SOLN: 5.0000 mg | Freq: Once | ORAL | Status: DC | PRN

## 2024-10-30 MED ORDER — SUCCINYLCHOLINE CHLORIDE 200 MG/10ML IV SOSY
PREFILLED_SYRINGE | INTRAVENOUS | Status: AC
  Filled 2024-10-30: qty 10

## 2024-10-30 MED ORDER — ONDANSETRON HCL 4 MG/2ML IJ SOLN
INTRAMUSCULAR | Status: DC | PRN
  Administered 2024-10-30: 14:00:00 4 mg via INTRAVENOUS

## 2024-10-30 MED ORDER — POVIDONE-IODINE 10 % EX SWAB: 2.0000 | Freq: Once | CUTANEOUS | Status: DC

## 2024-10-30 MED ORDER — ACETAMINOPHEN 10 MG/ML IV SOLN
1000.0000 mg | Freq: Once | INTRAVENOUS | Status: AC
  Administered 2024-10-30: 15:00:00 1000 mg via INTRAVENOUS

## 2024-10-30 MED ORDER — FENTANYL CITRATE (PF) 250 MCG/5ML IJ SOLN
INTRAMUSCULAR | Status: AC
  Filled 2024-10-30: qty 5

## 2024-10-30 MED ORDER — ZOLPIDEM TARTRATE 5 MG PO TABS: 5.0000 mg | ORAL_TABLET | Freq: Every evening | ORAL | Status: DC | PRN

## 2024-10-30 MED ORDER — LACTATED RINGERS IV SOLN: INTRAVENOUS | Status: DC

## 2024-10-30 MED ORDER — IBUPROFEN 600 MG PO TABS
600.0000 mg | ORAL_TABLET | Freq: Four times a day (QID) | ORAL | Status: DC
  Administered 2024-10-30 (×4): 600 mg via ORAL
  Filled 2024-10-30 (×4): qty 1

## 2024-10-30 MED ORDER — FAMOTIDINE 20 MG PO TABS
40.0000 mg | ORAL_TABLET | Freq: Once | ORAL | Status: AC
  Administered 2024-10-30: 13:00:00 40 mg via ORAL
  Filled 2024-10-30: qty 2

## 2024-10-30 MED ORDER — ROCURONIUM BROMIDE 10 MG/ML (PF) SYRINGE
PREFILLED_SYRINGE | INTRAVENOUS | Status: AC
  Filled 2024-10-30: qty 10

## 2024-10-30 MED ORDER — SODIUM CHLORIDE 0.9 % IV SOLN: 250.0000 mL | INTRAVENOUS | Status: AC | PRN

## 2024-10-30 MED ORDER — METHYLERGONOVINE MALEATE 0.2 MG/ML IJ SOLN: 0.2000 mg | INTRAMUSCULAR | Status: DC | PRN

## 2024-10-30 MED ORDER — OXYCODONE HCL 5 MG PO TABS: 5.0000 mg | ORAL_TABLET | ORAL | Status: DC | PRN

## 2024-10-30 MED ORDER — PROPOFOL 10 MG/ML IV BOLUS
INTRAVENOUS | Status: DC | PRN
  Administered 2024-10-30: 14:00:00 200 mg via INTRAVENOUS

## 2024-10-30 MED ORDER — BUPIVACAINE HCL (PF) 0.25 % IJ SOLN
INTRAMUSCULAR | Status: DC | PRN
  Administered 2024-10-30: 14:00:00 10 mL

## 2024-10-30 MED ORDER — LIDOCAINE HCL (PF) 1 % IJ SOLN: 30.0000 mL | INTRAMUSCULAR | Status: DC | PRN

## 2024-10-30 MED ORDER — COCONUT OIL OIL: 1.0000 | TOPICAL_OIL | Status: DC | PRN

## 2024-10-30 MED ORDER — SUCCINYLCHOLINE CHLORIDE 200 MG/10ML IV SOSY
PREFILLED_SYRINGE | INTRAVENOUS | Status: DC | PRN
  Administered 2024-10-30: 14:00:00 200 mg via INTRAVENOUS

## 2024-10-30 MED ORDER — SIMETHICONE 80 MG PO CHEW: 80.0000 mg | CHEWABLE_TABLET | ORAL | Status: DC | PRN

## 2024-10-30 MED ORDER — WITCH HAZEL-GLYCERIN EX PADS: 1.0000 | MEDICATED_PAD | CUTANEOUS | Status: DC | PRN

## 2024-10-30 MED ORDER — STERILE WATER FOR IRRIGATION IR SOLN
Status: DC | PRN
  Administered 2024-10-30: 14:00:00 1000 mL

## 2024-10-30 MED ORDER — SODIUM CHLORIDE 0.9% FLUSH
3.0000 mL | Freq: Two times a day (BID) | INTRAVENOUS | Status: DC
  Administered 2024-10-30: 09:00:00 3 mL via INTRAVENOUS

## 2024-10-30 MED ORDER — ONDANSETRON HCL 4 MG/2ML IJ SOLN
INTRAMUSCULAR | Status: AC
  Filled 2024-10-30: qty 2

## 2024-10-30 MED ORDER — OXYTOCIN-SODIUM CHLORIDE 30-0.9 UT/500ML-% IV SOLN
2.5000 [IU]/h | INTRAVENOUS | Status: DC
  Administered 2024-10-30: 04:00:00 2.5 [IU]/h via INTRAVENOUS
  Filled 2024-10-30: qty 500

## 2024-10-30 MED ORDER — ONDANSETRON HCL 4 MG PO TABS: 4.0000 mg | ORAL_TABLET | ORAL | Status: DC | PRN

## 2024-10-30 MED ORDER — ACETAMINOPHEN 10 MG/ML IV SOLN
INTRAVENOUS | Status: AC
  Filled 2024-10-30: qty 100

## 2024-10-30 MED ORDER — PHENYLEPHRINE 80 MCG/ML (10ML) SYRINGE FOR IV PUSH (FOR BLOOD PRESSURE SUPPORT): 80.0000 ug | PREFILLED_SYRINGE | INTRAVENOUS | Status: DC | PRN

## 2024-10-30 MED ORDER — DIBUCAINE (PERIANAL) 1 % EX OINT: 1.0000 | TOPICAL_OINTMENT | CUTANEOUS | Status: DC | PRN

## 2024-10-30 MED ORDER — BENZOCAINE-MENTHOL 20-0.5 % EX AERO: 1.0000 | INHALATION_SPRAY | CUTANEOUS | Status: DC | PRN

## 2024-10-30 MED ORDER — OXYTOCIN-SODIUM CHLORIDE 30-0.9 UT/500ML-% IV SOLN: 2.5000 [IU]/h | INTRAVENOUS | Status: DC | PRN

## 2024-10-30 MED ORDER — OXYCODONE HCL 5 MG PO TABS
10.0000 mg | ORAL_TABLET | ORAL | Status: DC | PRN
  Filled 2024-10-30: qty 2

## 2024-10-30 MED ORDER — FENTANYL CITRATE (PF) 250 MCG/5ML IJ SOLN
INTRAMUSCULAR | Status: DC | PRN
  Administered 2024-10-30: 14:00:00 100 ug via INTRAVENOUS
  Administered 2024-10-30: 14:00:00 150 ug via INTRAVENOUS

## 2024-10-30 MED ORDER — SOD CITRATE-CITRIC ACID 500-334 MG/5ML PO SOLN: 30.0000 mL | ORAL | Status: DC | PRN

## 2024-10-30 MED ORDER — FENTANYL CITRATE (PF) 100 MCG/2ML IJ SOLN
INTRAMUSCULAR | Status: AC
  Filled 2024-10-30: qty 2

## 2024-10-30 MED ORDER — OXYCODONE-ACETAMINOPHEN 5-325 MG PO TABS: 1.0000 | ORAL_TABLET | ORAL | Status: DC | PRN

## 2024-10-30 MED ORDER — OXYCODONE-ACETAMINOPHEN 5-325 MG PO TABS: 2.0000 | ORAL_TABLET | ORAL | Status: DC | PRN

## 2024-10-30 MED ORDER — EPHEDRINE 5 MG/ML INJ: 10.0000 mg | INTRAVENOUS | Status: DC | PRN

## 2024-10-30 MED ORDER — DIPHENHYDRAMINE HCL 50 MG/ML IJ SOLN: 12.5000 mg | INTRAMUSCULAR | Status: DC | PRN

## 2024-10-30 MED ORDER — LACTATED RINGERS IV SOLN
500.0000 mL | Freq: Once | INTRAVENOUS | Status: AC
  Administered 2024-10-30: 03:00:00 500 mL via INTRAVENOUS

## 2024-10-30 MED ORDER — METHYLERGONOVINE MALEATE 0.2 MG PO TABS: 0.2000 mg | ORAL_TABLET | ORAL | Status: DC | PRN

## 2024-10-30 MED ORDER — INFLUENZA VIRUS VACC SPLIT PF (FLUZONE) 0.5 ML IM SUSY
0.5000 mL | PREFILLED_SYRINGE | INTRAMUSCULAR | Status: DC
  Filled 2024-10-30: qty 0.5

## 2024-10-30 MED ORDER — PRENATAL MULTIVITAMIN CH
1.0000 | ORAL_TABLET | Freq: Every day | ORAL | Status: DC
  Administered 2024-10-30 – 2024-10-31 (×2): 1 via ORAL
  Filled 2024-10-30 (×2): qty 1

## 2024-10-30 MED ORDER — LIDOCAINE 2% (20 MG/ML) 5 ML SYRINGE
INTRAMUSCULAR | Status: AC
  Filled 2024-10-30: qty 5

## 2024-10-30 MED ORDER — DIPHENHYDRAMINE HCL 25 MG PO CAPS: 25.0000 mg | ORAL_CAPSULE | Freq: Four times a day (QID) | ORAL | Status: DC | PRN

## 2024-10-30 MED ORDER — FENTANYL-BUPIVACAINE-NACL 0.5-0.125-0.9 MG/250ML-% EP SOLN: 12.0000 mL/h | EPIDURAL | Status: DC | PRN

## 2024-10-30 MED ORDER — ACETAMINOPHEN 325 MG PO TABS
650.0000 mg | ORAL_TABLET | ORAL | Status: DC | PRN
  Administered 2024-10-30: 04:00:00 650 mg via ORAL
  Filled 2024-10-30: qty 2

## 2024-10-30 MED ORDER — ONDANSETRON HCL 4 MG/2ML IJ SOLN: 4.0000 mg | Freq: Once | INTRAMUSCULAR | Status: DC | PRN

## 2024-10-30 MED ORDER — TERBUTALINE SULFATE 1 MG/ML IJ SOLN: 0.2500 mg | Freq: Once | INTRAMUSCULAR | Status: DC | PRN

## 2024-10-30 MED ORDER — MIDAZOLAM HCL 5 MG/5ML IJ SOLN
INTRAMUSCULAR | Status: DC | PRN
  Administered 2024-10-30 (×2): 1 mg via INTRAVENOUS

## 2024-10-30 MED ORDER — ONDANSETRON HCL 4 MG/2ML IJ SOLN: 4.0000 mg | INTRAMUSCULAR | Status: DC | PRN

## 2024-10-30 MED ORDER — DEXMEDETOMIDINE HCL IN NACL 80 MCG/20ML IV SOLN
INTRAVENOUS | Status: AC
  Filled 2024-10-30: qty 20

## 2024-10-30 MED ORDER — METOCLOPRAMIDE HCL 10 MG PO TABS
10.0000 mg | ORAL_TABLET | Freq: Once | ORAL | Status: AC
  Administered 2024-10-30: 13:00:00 10 mg via ORAL
  Filled 2024-10-30: qty 1

## 2024-10-30 MED ORDER — DEXAMETHASONE SODIUM PHOSPHATE 4 MG/ML IJ SOLN
INTRAMUSCULAR | Status: DC | PRN
  Administered 2024-10-30: 14:00:00 10 mg via INTRAVENOUS

## 2024-10-30 MED ORDER — ACETAMINOPHEN 325 MG PO TABS
650.0000 mg | ORAL_TABLET | ORAL | Status: DC | PRN
  Administered 2024-10-30 – 2024-10-31 (×3): 650 mg via ORAL
  Filled 2024-10-30 (×3): qty 2

## 2024-10-30 MED ORDER — ONDANSETRON HCL 4 MG/2ML IJ SOLN: 4.0000 mg | Freq: Four times a day (QID) | INTRAMUSCULAR | Status: DC | PRN

## 2024-10-30 MED ORDER — MEPERIDINE HCL 25 MG/ML IJ SOLN: 6.2500 mg | INTRAMUSCULAR | Status: DC | PRN

## 2024-10-30 MED ORDER — LIDOCAINE HCL (CARDIAC) PF 100 MG/5ML IV SOSY
PREFILLED_SYRINGE | INTRAVENOUS | Status: DC | PRN
  Administered 2024-10-30: 14:00:00 80 mg via INTRATRACHEAL

## 2024-10-30 MED ORDER — OXYTOCIN BOLUS FROM INFUSION
333.0000 mL | Freq: Once | INTRAVENOUS | Status: AC
  Administered 2024-10-30: 03:00:00 333 mL via INTRAVENOUS

## 2024-10-30 MED ORDER — FENTANYL CITRATE (PF) 100 MCG/2ML IJ SOLN: 25.0000 ug | INTRAMUSCULAR | Status: DC | PRN

## 2024-10-30 MED ORDER — MISOPROSTOL 25 MCG QUARTER TABLET: 25.0000 ug | ORAL_TABLET | ORAL | Status: DC | PRN

## 2024-10-30 MED ORDER — LACTATED RINGERS IV SOLN: 500.0000 mL | INTRAVENOUS | Status: DC | PRN

## 2024-10-30 MED ORDER — IBUPROFEN 800 MG PO TABS
800.0000 mg | ORAL_TABLET | Freq: Three times a day (TID) | ORAL | Status: DC
  Administered 2024-10-31 (×2): 800 mg via ORAL
  Filled 2024-10-30 (×2): qty 1

## 2024-10-30 MED ORDER — GLYCOPYRROLATE 0.2 MG/ML IJ SOLN
INTRAMUSCULAR | Status: DC | PRN
  Administered 2024-10-30: 14:00:00 .1 mg via INTRAVENOUS

## 2024-10-30 MED ORDER — SENNOSIDES-DOCUSATE SODIUM 8.6-50 MG PO TABS
2.0000 | ORAL_TABLET | Freq: Every day | ORAL | Status: DC
  Administered 2024-10-31: 13:00:00 2 via ORAL
  Filled 2024-10-30: qty 2

## 2024-10-30 MED ORDER — FENTANYL CITRATE (PF) 100 MCG/2ML IJ SOLN: 50.0000 ug | INTRAMUSCULAR | Status: DC | PRN

## 2024-10-30 MED ORDER — SODIUM CHLORIDE 0.9% FLUSH: 3.0000 mL | INTRAVENOUS | Status: DC | PRN

## 2024-10-30 MED ORDER — PHENYLEPHRINE HCL (PRESSORS) 10 MG/ML IV SOLN
INTRAVENOUS | Status: DC | PRN
  Administered 2024-10-30 (×2): 160 ug via INTRAVENOUS

## 2024-10-30 MED ORDER — MIDAZOLAM HCL 2 MG/2ML IJ SOLN
INTRAMUSCULAR | Status: AC
  Filled 2024-10-30: qty 2

## 2024-10-30 MED ORDER — TETANUS-DIPHTH-ACELL PERTUSSIS 5-2-15.5 LF-MCG/0.5 IM SUSP: 0.5000 mL | Freq: Once | INTRAMUSCULAR | Status: DC

## 2024-10-30 MED ORDER — OXYCODONE HCL 5 MG PO TABS: 5.0000 mg | ORAL_TABLET | Freq: Once | ORAL | Status: DC | PRN

## 2024-10-30 MED ORDER — PROPOFOL 10 MG/ML IV BOLUS
INTRAVENOUS | Status: AC
  Filled 2024-10-30: qty 20

## 2024-10-30 SURGICAL SUPPLY — 25 items
BENZOIN TINCTURE PRP APPL 2/3 (GAUZE/BANDAGES/DRESSINGS) ×1 IMPLANT
BLADE SURG 11 STRL SS (BLADE) ×1 IMPLANT
CLOTH BEACON ORANGE TIMEOUT ST (SAFETY) ×1 IMPLANT
DISSECTOR SURG LIGASURE 21 (MISCELLANEOUS) IMPLANT
DRSG OPSITE POSTOP 3X4 (GAUZE/BANDAGES/DRESSINGS) ×1 IMPLANT
DURAPREP 26ML APPLICATOR (WOUND CARE) ×1 IMPLANT
ELECTRODE REM PT RTRN 9FT ADLT (ELECTROSURGICAL) ×1 IMPLANT
GLOVE BIO SURGEON STRL SZ 6.5 (GLOVE) ×1 IMPLANT
GLOVE BIOGEL PI IND STRL 7.0 (GLOVE) ×1 IMPLANT
GOWN STRL REUS W/TWL LRG LVL3 (GOWN DISPOSABLE) ×2 IMPLANT
NEEDLE HYPO 22GX1.5 SAFETY (NEEDLE) IMPLANT
NS IRRIG 1000ML POUR BTL (IV SOLUTION) ×1 IMPLANT
PACK ABDOMINAL MINOR (CUSTOM PROCEDURE TRAY) ×1 IMPLANT
PENCIL BUTTON HOLSTER BLD 10FT (ELECTRODE) IMPLANT
PROTECTOR NERVE ULNAR (MISCELLANEOUS) ×1 IMPLANT
SPONGE LAP 4X18 RFD (DISPOSABLE) ×1 IMPLANT
STRIP CLOSURE SKIN 1/2X4 (GAUZE/BANDAGES/DRESSINGS) ×1 IMPLANT
SUT MNCRL AB 3-0 PS2 27 (SUTURE) IMPLANT
SUT MNCRL AB 4-0 PS2 18 (SUTURE) ×1 IMPLANT
SUT PLAIN 0 NONE (SUTURE) ×1 IMPLANT
SUT VIC AB 2-0 CT1 TAPERPNT 27 (SUTURE) ×1 IMPLANT
SUT VIC AB 2-0 SH 27XBRD (SUTURE) IMPLANT
SYR CONTROL 10ML LL (SYRINGE) IMPLANT
TOWEL OR 17X24 6PK STRL BLUE (TOWEL DISPOSABLE) ×2 IMPLANT
TRAY FOLEY CATH SILVER 14FR (SET/KITS/TRAYS/PACK) ×1 IMPLANT

## 2024-10-30 NOTE — Transfer of Care (Signed)
 Immediate Anesthesia Transfer of Care Note  Patient: Diana Clark  Procedure(s) Performed: LIGATION, FALLOPIAN TUBE, POSTPARTUM  Patient Location: PACU  Anesthesia Type:General  Level of Consciousness: awake, alert , and oriented  Airway & Oxygen Therapy: Patient Spontanous Breathing  Post-op Assessment: Report given to RN and Post -op Vital signs reviewed and stable  Post vital signs: Reviewed and stable  Last Vitals:  Vitals Value Taken Time  BP 103/65 10/30/24 14:46  Temp    Pulse 80 10/30/24 14:52  Resp 13 10/30/24 14:52  SpO2 98 % 10/30/24 14:52  Vitals shown include unfiled device data.  Last Pain:  Vitals:   10/30/24 1005  TempSrc:   PainSc: 2          Complications: No notable events documented.

## 2024-10-30 NOTE — Plan of Care (Signed)

## 2024-10-30 NOTE — Anesthesia Preprocedure Evaluation (Addendum)
 Anesthesia Evaluation  Patient identified by MRN, date of birth, ID band Patient awake    Reviewed: Allergy & Precautions, Patient's Chart, lab work & pertinent test results  Airway Mallampati: II  TM Distance: >3 FB     Dental no notable dental hx. (+) Teeth Intact, Dental Advisory Given   Pulmonary Current Smoker and Patient abstained from smoking.   Pulmonary exam normal breath sounds clear to auscultation       Cardiovascular negative cardio ROS Normal cardiovascular exam Rhythm:Regular Rate:Normal     Neuro/Psych negative neurological ROS  negative psych ROS   GI/Hepatic Neg liver ROS,GERD  ,,  Endo/Other  negative endocrine ROS    Renal/GU negative Renal ROS  negative genitourinary   Musculoskeletal negative musculoskeletal ROS (+)    Abdominal  (+) + obese  Peds  Hematology  (+) Blood dyscrasia, anemia   Anesthesia Other Findings   Reproductive/Obstetrics (+) Pregnancy Hx/o STD                              Anesthesia Physical Anesthesia Plan  ASA: 2  Anesthesia Plan: Epidural   Post-op Pain Management:    Induction: Intravenous  PONV Risk Score and Plan: Treatment may vary due to age or medical condition  Airway Management Planned: Natural Airway  Additional Equipment: Fetal Monitoring and None  Intra-op Plan:   Post-operative Plan:   Informed Consent: I have reviewed the patients History and Physical, chart, labs and discussed the procedure including the risks, benefits and alternatives for the proposed anesthesia with the patient or authorized representative who has indicated his/her understanding and acceptance.       Plan Discussed with: Anesthesiologist  Anesthesia Plan Comments: (Patient delivered before epidural placed.)         Anesthesia Quick Evaluation

## 2024-10-30 NOTE — H&P (View-Only) (Signed)
 Post Partum Day 0 Subjective: up ad lib, voiding, tolerating PO, + flatus, and lochia mild. She complains of crampy abdominal pain. Tylenol  not helping. Rates at 8/10. Bonding well with baby - bottle and breastfeeding. Desires BTL today. Has been NPO since 6am   Objective: Blood pressure 98/73, pulse 88, temperature 98.5 F (36.9 C), temperature source Oral, resp. rate 18, height 5' 4 (1.626 m), weight 80 kg, last menstrual period 01/19/2024, SpO2 100%, unknown if currently breastfeeding.  Physical Exam:  General: alert, cooperative, and no distress Lochia: appropriate Uterine Fundus: firm Incision: n/a DVT Evaluation: No evidence of DVT seen on physical exam.  Recent Labs    10/30/24 0228 10/30/24 0456  HGB 12.5 11.3*  HCT 36.9 33.3*    Assessment/Plan: 33yo [id number] female on PPD#0 s/p svd Routine pp care - working on lactation, ibuprofen  now. Desires permanent sterilization - at this time can be scheduled at 14:45 per OR. Pt agrees to this. NPO till then. Consent confirmed. To OR when ready  Discharge planning - possible discharge home tomorrow    LOS: 0 days   Mahli Glahn W Kharlie Bring, DO 10/30/2024, 10:40 AM

## 2024-10-30 NOTE — Plan of Care (Signed)
 Problem: Education: Goal: Knowledge of General Education information will improve Description: Including pain rating scale, medication(s)/side effects and non-pharmacologic comfort measures 10/30/2024 0837 by Madison Rosina LABOR, LPN Outcome: Progressing 10/30/2024 0836 by Madison Rosina LABOR, LPN Outcome: Progressing   Problem: Health Behavior/Discharge Planning: Goal: Ability to manage health-related needs will improve 10/30/2024 0837 by Madison Rosina LABOR, LPN Outcome: Progressing 10/30/2024 0836 by Madison Rosina LABOR, LPN Outcome: Progressing   Problem: Clinical Measurements: Goal: Ability to maintain clinical measurements within normal limits will improve 10/30/2024 0837 by Madison Rosina LABOR, LPN Outcome: Progressing 10/30/2024 0836 by Madison Rosina LABOR, LPN Outcome: Progressing Goal: Will remain free from infection 10/30/2024 0837 by Madison Rosina LABOR, LPN Outcome: Progressing 10/30/2024 0836 by Madison Rosina LABOR, LPN Outcome: Progressing Goal: Diagnostic test results will improve 10/30/2024 0837 by Madison Rosina LABOR, LPN Outcome: Progressing 10/30/2024 0836 by Madison Rosina LABOR, LPN Outcome: Progressing Goal: Respiratory complications will improve 10/30/2024 0837 by Madison Rosina LABOR, LPN Outcome: Progressing 10/30/2024 0836 by Madison Rosina LABOR, LPN Outcome: Progressing Goal: Cardiovascular complication will be avoided 10/30/2024 0837 by Madison Rosina LABOR, LPN Outcome: Progressing 10/30/2024 0836 by Madison Rosina LABOR, LPN Outcome: Progressing   Problem: Activity: Goal: Risk for activity intolerance will decrease 10/30/2024 0837 by Madison Rosina LABOR, LPN Outcome: Progressing 10/30/2024 0836 by Madison Rosina LABOR, LPN Outcome: Progressing   Problem: Nutrition: Goal: Adequate nutrition will be maintained 10/30/2024 0837 by Madison Rosina LABOR, LPN Outcome: Progressing 10/30/2024 0836 by Madison Rosina LABOR, LPN Outcome: Progressing   Problem: Coping: Goal: Level of anxiety will decrease 10/30/2024 0837 by Madison Rosina LABOR, LPN Outcome: Progressing 10/30/2024 0836 by Madison Rosina LABOR, LPN Outcome: Progressing   Problem: Elimination: Goal: Will not experience complications related to bowel motility 10/30/2024 0837 by Madison Rosina LABOR, LPN Outcome: Progressing 10/30/2024 0836 by Madison Rosina LABOR, LPN Outcome: Progressing Goal: Will not experience complications related to urinary retention 10/30/2024 0837 by Madison Rosina LABOR, LPN Outcome: Progressing 10/30/2024 0836 by Madison Rosina LABOR, LPN Outcome: Progressing   Problem: Pain Managment: Goal: General experience of comfort will improve and/or be controlled 10/30/2024 0837 by Madison Rosina LABOR, LPN Outcome: Progressing 10/30/2024 0836 by Madison Rosina LABOR, LPN Outcome: Progressing   Problem: Safety: Goal: Ability to remain free from injury will improve 10/30/2024 0837 by Madison Rosina LABOR, LPN Outcome: Progressing 10/30/2024 0836 by Madison Rosina LABOR, LPN Outcome: Progressing   Problem: Skin Integrity: Goal: Risk for impaired skin integrity will decrease 10/30/2024 0837 by Madison Rosina LABOR, LPN Outcome: Progressing 10/30/2024 0836 by Madison Rosina LABOR, LPN Outcome: Progressing   Problem: Education: Goal: Knowledge of Childbirth will improve 10/30/2024 0837 by Madison Rosina LABOR, LPN Outcome: Progressing 10/30/2024 0836 by Madison Rosina LABOR, LPN Outcome: Progressing Goal: Ability to make informed decisions regarding treatment and plan of care will improve 10/30/2024 0837 by Madison Rosina LABOR, LPN Outcome: Progressing 10/30/2024 0836 by Madison Rosina LABOR, LPN Outcome: Progressing Goal: Ability to state and carry out methods to decrease the pain will improve 10/30/2024 0837 by Madison Rosina LABOR, LPN Outcome: Progressing 10/30/2024 0836 by Madison Rosina LABOR, LPN Outcome: Progressing Goal: Individualized Educational Video(s) 10/30/2024 0837 by Madison Rosina LABOR, LPN Outcome: Progressing 10/30/2024 0836 by Madison Rosina LABOR, LPN Outcome: Progressing   Problem: Coping: Goal:  Ability to verbalize concerns and feelings about labor and delivery will improve 10/30/2024 0837 by Madison Rosina LABOR, LPN Outcome: Progressing 10/30/2024 0836 by Madison Rosina LABOR, LPN Outcome: Progressing   Problem: Life Cycle: Goal: Ability to make normal progression  through stages of labor will improve 10/30/2024 0837 by Madison Rosina LABOR, LPN Outcome: Progressing 10/30/2024 0836 by Madison Rosina LABOR, LPN Outcome: Progressing Goal: Ability to effectively push during vaginal delivery will improve 10/30/2024 0837 by Madison Rosina LABOR, LPN Outcome: Progressing 10/30/2024 0836 by Madison Rosina LABOR, LPN Outcome: Progressing   Problem: Role Relationship: Goal: Will demonstrate positive interactions with the child 10/30/2024 0837 by Madison Rosina LABOR, LPN Outcome: Progressing 10/30/2024 0836 by Madison Rosina LABOR, LPN Outcome: Progressing   Problem: Safety: Goal: Risk of complications during labor and delivery will decrease 10/30/2024 0837 by Madison Rosina LABOR, LPN Outcome: Progressing 10/30/2024 0836 by Madison Rosina LABOR, LPN Outcome: Progressing   Problem: Pain Management: Goal: Relief or control of pain from uterine contractions will improve 10/30/2024 0837 by Madison Rosina LABOR, LPN Outcome: Progressing 10/30/2024 0836 by Madison Rosina LABOR, LPN Outcome: Progressing   Problem: Education: Goal: Knowledge of condition will improve 10/30/2024 0837 by Madison Rosina LABOR, LPN Outcome: Progressing 10/30/2024 0836 by Madison Rosina LABOR, LPN Outcome: Progressing Goal: Individualized Educational Video(s) 10/30/2024 0837 by Madison Rosina LABOR, LPN Outcome: Progressing 10/30/2024 0836 by Madison Rosina LABOR, LPN Outcome: Progressing Goal: Individualized Newborn Educational Video(s) 10/30/2024 0837 by Madison Rosina LABOR, LPN Outcome: Progressing 10/30/2024 0836 by Madison Rosina LABOR, LPN Outcome: Progressing   Problem: Activity: Goal: Will verbalize the importance of balancing activity with adequate rest periods 10/30/2024 0837 by  Madison Rosina LABOR, LPN Outcome: Progressing 10/30/2024 0836 by Madison Rosina LABOR, LPN Outcome: Progressing Goal: Ability to tolerate increased activity will improve 10/30/2024 0837 by Madison Rosina LABOR, LPN Outcome: Progressing 10/30/2024 0836 by Madison Rosina LABOR, LPN Outcome: Progressing   Problem: Coping: Goal: Ability to identify and utilize available resources and services will improve 10/30/2024 0837 by Madison Rosina LABOR, LPN Outcome: Progressing 10/30/2024 0836 by Madison Rosina LABOR, LPN Outcome: Progressing   Problem: Life Cycle: Goal: Chance of risk for complications during the postpartum period will decrease 10/30/2024 0837 by Madison Rosina LABOR, LPN Outcome: Progressing 10/30/2024 0836 by Madison Rosina LABOR, LPN Outcome: Progressing   Problem: Role Relationship: Goal: Ability to demonstrate positive interaction with newborn will improve 10/30/2024 0837 by Madison Rosina LABOR, LPN Outcome: Progressing 10/30/2024 0836 by Madison Rosina LABOR, LPN Outcome: Progressing   Problem: Skin Integrity: Goal: Demonstration of wound healing without infection will improve 10/30/2024 0837 by Madison Rosina LABOR, LPN Outcome: Progressing 10/30/2024 0836 by Madison Rosina LABOR, LPN Outcome: Progressing

## 2024-10-30 NOTE — Progress Notes (Signed)
 Post Partum Day 0 Subjective: up ad lib, voiding, tolerating PO, + flatus, and lochia mild. She complains of crampy abdominal pain. Tylenol  not helping. Rates at 8/10. Bonding well with baby - bottle and breastfeeding. Desires BTL today. Has been NPO since 6am   Objective: Blood pressure 98/73, pulse 88, temperature 98.5 F (36.9 C), temperature source Oral, resp. rate 18, height 5' 4 (1.626 m), weight 80 kg, last menstrual period 01/19/2024, SpO2 100%, unknown if currently breastfeeding.  Physical Exam:  General: alert, cooperative, and no distress Lochia: appropriate Uterine Fundus: firm Incision: n/a DVT Evaluation: No evidence of DVT seen on physical exam.  Recent Labs    10/30/24 0228 10/30/24 0456  HGB 12.5 11.3*  HCT 36.9 33.3*    Assessment/Plan: 33yo [id number] female on PPD#0 s/p svd Routine pp care - working on lactation, ibuprofen  now. Desires permanent sterilization - at this time can be scheduled at 14:45 per OR. Pt agrees to this. NPO till then. Consent confirmed. To OR when ready  Discharge planning - possible discharge home tomorrow    LOS: 0 days   Mahli Glahn W Kharlie Bring, DO 10/30/2024, 10:40 AM

## 2024-10-30 NOTE — Anesthesia Preprocedure Evaluation (Addendum)
 Anesthesia Evaluation  Patient identified by MRN, date of birth, ID band Patient awake    Reviewed: Allergy & Precautions, H&P , NPO status , Patient's Chart, lab work & pertinent test results  Airway Mallampati: I  TM Distance: >3 FB Neck ROM: Full    Dental no notable dental hx. (+) Teeth Intact, Dental Advisory Given   Pulmonary neg pulmonary ROS, Current Smoker   Pulmonary exam normal breath sounds clear to auscultation       Cardiovascular Exercise Tolerance: Good negative cardio ROS Normal cardiovascular exam Rhythm:Regular Rate:Normal     Neuro/Psych negative neurological ROS  negative psych ROS   GI/Hepatic negative GI ROS, Neg liver ROS,,,  Endo/Other  negative endocrine ROS    Renal/GU negative Renal ROS  negative genitourinary   Musculoskeletal negative musculoskeletal ROS (+)    Abdominal   Peds negative pediatric ROS (+)  Hematology negative hematology ROS (+)   Anesthesia Other Findings   Reproductive/Obstetrics negative OB ROS                              Anesthesia Physical Anesthesia Plan  ASA: 2  Anesthesia Plan: General   Post-op Pain Management: Minimal or no pain anticipated   Induction: Intravenous and Cricoid pressure planned  PONV Risk Score and Plan: 2 and Ondansetron  and Treatment may vary due to age or medical condition  Airway Management Planned: Oral ETT  Additional Equipment: None  Intra-op Plan:   Post-operative Plan:   Informed Consent: I have reviewed the patients History and Physical, chart, labs and discussed the procedure including the risks, benefits and alternatives for the proposed anesthesia with the patient or authorized representative who has indicated his/her understanding and acceptance.       Plan Discussed with: Anesthesiologist  Anesthesia Plan Comments:          Anesthesia Quick Evaluation

## 2024-10-30 NOTE — H&P (Signed)
 Diana Clark is a 33 y.o. female presenting for labor  34 year old G4 P3-0-0-3 at 40+5 presents to maternity admission complaining of painful contractions and was found to be in labor.  Upon admission she was found to be 6 cm dilated and immediately transferred to labor and delivery.  Her pregnancy has been otherwise uncomplicated. OB History     Gravida  4   Para  3   Term  3   Preterm      AB      Living  3      SAB      IAB      Ectopic      Multiple  0   Live Births  3          Past Medical History:  Diagnosis Date   Hx of chlamydia infection    Hx of gonorrhea    Medical history non-contributory    SVD (spontaneous vaginal delivery) 08/28/2014   SVD (spontaneous vaginal delivery) 03/01/2018   Past Surgical History:  Procedure Laterality Date   INCISE AND DRAIN ABCESS  2005   located on the right buttocks   NO PAST SURGERIES     Family History: family history includes Hypertension in her mother and paternal grandmother. Social History:  reports that she has been smoking cigarettes. She quit smokeless tobacco use about 10 years ago. She reports that she does not currently use alcohol. She reports that she does not use drugs.     Maternal Diabetes: No Genetic Screening: Normal Maternal Ultrasounds/Referrals: Normal Fetal Ultrasounds or other Referrals:  None Maternal Substance Abuse:  No Significant Maternal Medications:  None Significant Maternal Lab Results:  None Number of Prenatal Visits:greater than 3 verified prenatal visits Maternal Vaccinations:RSV: Given during pregnancy >/=14 days ago and TDap Other Comments:  None  Review of Systems History Dilation: 6 Exam by:: Emilio, CNM Blood pressure 128/83, pulse 97, resp. rate 18, last menstrual period 01/19/2024, unknown if currently breastfeeding. Exam Physical Exam  Alert and orient x 3, uncomfortable-appearing Gravid, soft Fetal heart rate 130s, reactive Scarring and pustules of bilateral  inner thighs consistent with hidradenitis Prenatal labs: ABO, Rh: --/--/PENDING (12/16 9771) Antibody: PENDING (12/16 0228) Rubella: Immune (06/30 0000) RPR: Nonreactive (06/30 0000)  HBsAg: Negative (06/30 0000)  HIV: Non-reactive (06/30 0000)  GBS: Negative/-- (11/17 0000)  Results for orders placed or performed during the hospital encounter of 10/30/24 (from the past 24 hours)  CBC     Status: Abnormal   Collection Time: 10/30/24  2:28 AM  Result Value Ref Range   WBC 12.1 (H) 4.0 - 10.5 K/uL   RBC 4.19 3.87 - 5.11 MIL/uL   Hemoglobin 12.5 12.0 - 15.0 g/dL   HCT 63.0 63.9 - 53.9 %   MCV 88.1 80.0 - 100.0 fL   MCH 29.8 26.0 - 34.0 pg   MCHC 33.9 30.0 - 36.0 g/dL   RDW 85.6 88.4 - 84.4 %   Platelets 323 150 - 400 K/uL   nRBC 0.0 0.0 - 0.2 %  Type and screen [facility]     Status: None (Preliminary result)   Collection Time: 10/30/24  2:28 AM  Result Value Ref Range   ABO/RH(D) PENDING    Antibody Screen PENDING    Sample Expiration      11/02/2024,2359 Performed at [facility], [address] [place], [place] [address]      Assessment/Plan: 1) admit 2) epidural on request   Marjorie  Krystalyn Kubota 10/30/2024, 3:10 AM

## 2024-10-30 NOTE — MAU Note (Signed)
 Diana Clark is a 33 y.o. at [redacted]w[redacted]d here in MAU reporting:   RN was called from lobby stating that Diana Clark felt the urge to push. RN and Psychologist, Educational CNM went to lobby and brought back to a room in MAU.  CNN at bedside and RN called labor and delivery due to having a CE of 6cm.  Okey, MD called.   CNM and RN transferred Pt in bed and taken to labor and delivery.    Pt gets care at [facility] and desires epidural.    Pain score:10/10 Vitals:   10/30/24 0223  BP: 128/83  Pulse: 97  Resp: 18     FHT: 125 Lab orders placed from triage: labor eval.

## 2024-10-30 NOTE — Op Note (Addendum)
 Operative Note    Preoperative Diagnosis Complete family/desires permanent sterilization S/P spontaneous vaginal delivery day 0   Postoperative Diagnosis: Same   Procedure: Bilateral salpingectomy    Surgeon: Delana BROCKS DO  Assist: RNFA Melissa   Anesthesia: General   Fluids: LR EBL: 10ml UOP: 50ml clear Meds: Local anesthetic - 0.25% marcaine  plain   Findings: Grossly normal appearing uterine fundus, long fallopian tubes bilaterally    Specimen: Bilateral fallopian tubes to pathology   Procedure Note Diana Clark was taken to the OR where general anesthesia administered and noted to be adequate. She was prepped and draped using normal sterile technique.  A small infraumbilical incision was made in a curvilinear fashion through the skin.  A hemostat was used to separate the underlying fat gently. The fascia was easily identified tented upward with clamps and incised, allowing access into the abdominal cavity. Omental and bowel tissue were gently pushed away till the uterine fundus clearly visualized.  The uterine cornua on the left was identified and the fallopian tube gently grabbed with a babcock and followed to its fimbriated end for identification. The ovary was also noted posterior to the tube. Using the handheld ligasure device, the fallopian tube was hemostatically excised till 1cm of the cornua.  The right fallopian tube was similarly identified, followed to its fimbriated end excised. Hemostasis was confirmed.  The peritoneum was then closed in a purse string fashion using 3-0 monocryl suture.  The fascia was closed next using o-vicryl suture also in a purse string fashion.  Finally the skin incision was closed with 4-0 vicryl in a subcuticular fashion with excellent cosmesis noted.  The subcutaneous tissue was infused with marcaine  and a honeycomb dressing placed.  Counts were noted to be correct. Pt was awakened and taken to recovery room. She tolerated the procedure  well.

## 2024-10-30 NOTE — Anesthesia Procedure Notes (Signed)
 Procedure Name: Intubation Date/Time: 10/30/2024 1:34 PM  Performed by: Purnell Louana KIDD, CRNAPre-anesthesia Checklist: Patient identified, Patient being monitored, Timeout performed, Emergency Drugs available and Suction available Patient Re-evaluated:Patient Re-evaluated prior to induction Oxygen Delivery Method: Circle System Utilized Preoxygenation: Pre-oxygenation with 100% oxygen Induction Type: IV induction and Rapid sequence Laryngoscope Size: Mac and 3 Grade View: Grade I Tube type: Oral Tube size: 7.0 mm Number of attempts: 1 Airway Equipment and Method: stylet and Stylet Placement Confirmation: ETT inserted through vocal cords under direct vision, positive ETCO2 and breath sounds checked- equal and bilateral Secured at: 22 cm Tube secured with: Tape Dental Injury: Teeth and Oropharynx as per pre-operative assessment

## 2024-10-30 NOTE — Lactation Note (Signed)
 This note was copied from a baby's chart. Lactation Consultation Note  Patient Name: Diana Clark Unijb'd Date: 10/30/2024 Age:34 hours  Reason for consult: Initial assessment;Term  P4, [redacted]w[redacted]d  Initial LC visit to see P4 mother and her baby Diana. Mother plans to breast and formula feed. She reports she has breastfeeding experience, her milk came in well with her other babies, she did not experience engorgement, and she supplements with formula when needed. Currently, she denies any questions or concerns.  Mother states she offered her baby her breast after delivery but he was not interested. She has since fed him formula. Mother was receptive to information regarding formula feeding volumes while establishing breastfeeding.   Mother encouraged to latch baby with feeding cues, place baby skin to skin if not latching, and call for assistance with breastfeeding as needed.  She was informed of hospital associated of O/P services, breastfeeding support groups, community resources, and phone # for post-discharge questions.     Maternal Data Does the patient have breastfeeding experience prior to this delivery?: Yes How long did the patient breastfeed?: breast fed her 3 other children for 3-6 months  Feeding Mother's Current Feeding Choice: Breast Milk and Formula  LATCH Score  Not observed  Interventions Interventions: Breast feeding basics reviewed;Education;LC Services brochure;CDC milk storage guidelines;CDC Guidelines for Breast Pump Cleaning  Discharge Discharge Education: Engorgement and breast care;Warning signs for feeding baby;Other (comment) (OP services) Pump: Personal;DEBP (expecting my pump to arrive this week/ ordered)  Consult Status Consult Status: Follow-up Date: 10/31/24 Follow-up type: In-patient    Joshua Line M 10/30/2024, 9:02 AM

## 2024-10-30 NOTE — Interval H&P Note (Signed)
 History and Physical Interval Note:  Pt is now on day 0 s/p svd. She still desires bilateral tubal ligation Discussed ligation vs salpingectomy Pt to remain NPO till surgery time  Consent confirmed To OR when ready   10/30/2024 10:47 AM  Diana Clark  has presented today for surgery, with the diagnosis of patient desires sterility.  The various methods of treatment have been discussed with the patient and family. After consideration of risks, benefits and other options for treatment, the patient has consented to  Procedures: LIGATION, FALLOPIAN TUBE, POSTPARTUM (N/A) as a surgical intervention.  The patient's history has been reviewed, patient examined, no change in status, stable for surgery.  I have reviewed the patient's chart and labs.  Questions were answered to the patient's satisfaction.     Ted LELON Solo

## 2024-10-31 ENCOUNTER — Encounter (HOSPITAL_COMMUNITY): Payer: Self-pay | Admitting: Obstetrics and Gynecology

## 2024-10-31 MED ORDER — FLUCONAZOLE 150 MG PO TABS
150.0000 mg | ORAL_TABLET | Freq: Once | ORAL | Status: AC
  Administered 2024-10-31: 11:00:00 150 mg via ORAL
  Filled 2024-10-31: qty 1

## 2024-10-31 MED ORDER — ACETAMINOPHEN 325 MG PO TABS: 650.0000 mg | ORAL_TABLET | Freq: Four times a day (QID) | ORAL | Status: AC | PRN

## 2024-10-31 MED ORDER — IBUPROFEN 800 MG PO TABS: 800.0000 mg | ORAL_TABLET | Freq: Three times a day (TID) | ORAL | Status: AC

## 2024-10-31 MED ORDER — WITCH HAZEL-GLYCERIN EX PADS: 1.0000 | MEDICATED_PAD | CUTANEOUS | Status: AC | PRN

## 2024-10-31 MED ORDER — BENZOCAINE-MENTHOL 20-0.5 % EX AERO: 1.0000 | INHALATION_SPRAY | CUTANEOUS | Status: AC | PRN

## 2024-10-31 NOTE — Discharge Summary (Signed)
 Postpartum Discharge Summary  Patient Name: Diana Clark DOB: 03/01/1991 MRN: [id number]  Date of admission: 10/30/2024 Delivery date:10/30/2024 Delivering provider: OKEY LEADER Date of discharge: 10/31/2024  Admitting diagnosis: Indication for care in labor or delivery [O75.9] Spontaneous vaginal delivery [O80] Intrauterine pregnancy: [redacted]w[redacted]d     Secondary diagnosis:  Principal Problem:   Indication for care in labor or delivery Active Problems:   Spontaneous vaginal delivery  Additional problems: yeast infection    Discharge diagnosis: Term Pregnancy Delivered                                              Post partum procedures:postpartum tubal ligation Augmentation: None Complications: None  Hospital course: Onset of Labor With Vaginal Delivery      33 y.o. yo [id number] at [redacted]w[redacted]d was admitted in Active Labor on 10/30/2024. Labor course was uncomplicated.  Membrane Rupture Time/Date: 2:40 AM,10/30/2024  Delivery Method:Vaginal, Spontaneous Operative Delivery:N/A Episiotomy: None Lacerations:   None  Patient had an uncomplicated postpartum course. She underwent post-partum bilateral salpingectomy on PPD0. Her pain is well controlled with PO medication. She is ambulating, tolerating a regular diet, passing flatus, and urinating well. She noted yeast infection sx prior to coming in for delivery and notes they continued after delivery so was treated with fluconazole  prior to discharge. Female fetus was circumcised on PPD1. Patient is discharged home in stable condition on 10/31/2024.  Newborn Data: Birth date:10/30/2024 Birth time:2:54 AM Gender:Female Living status:Living Apgars:8 ,9  Weight:3180 g  Magnesium  Sulfate received: No BMZ received: No Rhophylac:N/A MMR:N/A T-DaP:Given prenatally Flu: No RSV Vaccine received: Yes  Immunizations administered: Immunization History  Administered Date(s) Administered    sv, Bivalent, Protein Subunit Rsvpref,pf (Abrysvo) 09/26/2024    DTP 06/20/1990, 08/15/1990, 12/15/1990   HIB, Unspecified 06/20/1990, 08/15/1990, 12/15/1990, 06/07/1991   Influenza,inj,Quad PF,6+ Mos 01/24/2014   MMR 06/07/1991   Moderna Sars-Covid-2 Vaccination 03/26/2020   OPV 06/20/1990, 08/15/1990, 06/07/1991   Tdap 12/06/2017, 11/01/2018, 08/06/2024    Physical exam  Vitals:   10/30/24 1700 10/30/24 2047 10/31/24 0125 10/31/24 0540  BP: 105/68 114/72 111/70 98/71  Pulse: 79 99 84 73  Resp: 18 17 17 17   Temp:    (!) 97.3 F (36.3 C)  TempSrc:      SpO2: 100% 99% 97% 99%  Weight:      Height:       General: alert, cooperative, and no distress Respiratory: no increased work of breathing  Lochia: appropriate Uterine Fundus: firm, below the umbilicus  Incision: infra-umbilical incision w/ honeycomb overtop, mild saturation w/ dark brown blood DVT Evaluation: No evidence of DVT seen on physical exam.  Labs: Lab Results  Component Value Date   WBC 13.6 (H) 10/30/2024   HGB 11.3 (L) 10/30/2024   HCT 33.3 (L) 10/30/2024   MCV 87.2 10/30/2024   PLT 275 10/30/2024      Latest Ref Rng & Units 04/09/2024    8:38 PM  CMP  Glucose 70 - 99 mg/dL 99   BUN 6 - 20 mg/dL <5   Creatinine 9.55 - 1.00 mg/dL 9.43   Sodium 864 - 854 mmol/L 136   Potassium 3.5 - 5.1 mmol/L 3.4   Chloride 98 - 111 mmol/L 104   CO2 22 - 32 mmol/L 22   Calcium  8.9 - 10.3 mg/dL 9.0   Total Protein 6.5 - 8.1 g/dL 6.7  Total Bilirubin 0.0 - 1.2 mg/dL 0.5   Alkaline Phos 38 - 126 U/L 41   AST 15 - 41 U/L 20   ALT 0 - 44 U/L 23    Edinburgh Score:    10/30/2024    7:30 AM  Edinburgh Postnatal Depression Scale Screening Tool  I have been able to laugh and see the funny side of things. 0  I have looked forward with enjoyment to things. 0  I have blamed myself unnecessarily when things went wrong. 0  I have been anxious or worried for no good reason. 0  I have felt scared or panicky for no good reason. 0  Things have been getting on top of me. 0  I have been so  unhappy that I have had difficulty sleeping. 0  I have felt sad or miserable. 0  I have been so unhappy that I have been crying. 0  The thought of harming myself has occurred to me. 0  Edinburgh Postnatal Depression Scale Total 0      After visit meds:  Allergies as of 10/31/2024   No Known Allergies      Medication List     STOP taking these medications    metroNIDAZOLE  0.75 % vaginal gel Commonly known as: METROGEL        TAKE these medications    acetaminophen  325 MG tablet Commonly known as: Tylenol  Take 2 tablets (650 mg total) by mouth every 6 (six) hours as needed (for pain scale < 4).   benzocaine -Menthol  20-0.5 % Aero Commonly known as: DERMOPLAST Apply 1 Application topically as needed for irritation (perineal discomfort).   ibuprofen  800 MG tablet Commonly known as: ADVIL  Take 1 tablet (800 mg total) by mouth every 8 (eight) hours.   prenatal multivitamin Tabs tablet Take 1 tablet by mouth daily at 12 noon.   witch hazel-glycerin  pad Commonly known as: TUCKS Apply 1 Application topically as needed for hemorrhoids.         Discharge home in stable condition Infant Feeding: Bottle Infant Disposition:home with mother Discharge instruction: per After Visit Summary and Postpartum booklet. Activity: Advance as tolerated. Pelvic rest for 6 weeks.  Diet: routine diet Anticipated Birth Control: BTL done PP Postpartum Appointment:4 weeks    10/31/2024 Charmaine CHRISTELLA Oz, MD

## 2024-10-31 NOTE — Progress Notes (Addendum)
 Post Partum Day 1  Subjective: Patient is doing well. Notes incisional pain has improved with increased ibuprofen  dose, does not desire narcotic medication. Vaginal bleeding is minimal. She is ambulating and voiding without difficulty. Tolerating a regular diet. Is passing flatus, but has not had BM. She is bottle-feeding.  Patient notes had yeast infection sx prior to delivery, still notes some vulvar irritation.   Objective: Blood pressure 98/71, pulse 73, temperature (!) 97.3 F (36.3 C), resp. rate 17, height 5' 4 (1.626 m), weight 80 kg, last menstrual period 01/19/2024, SpO2 99%, unknown if currently breastfeeding.  Physical Exam:  General: alert and no distress Respiratory: no increased work of breathing  Lochia: appropriate Uterine Fundus: firm, below the umbilicus  Incision: infraumbilical incision with honey-comb over-top - w/ some old dark blood.  DVT Evaluation: No evidence of DVT seen on physical exam.  Recent Labs    10/30/24 0228 10/30/24 0456  HGB 12.5 11.3*  HCT 36.9 33.3*    Assessment/Plan: 33 yo G4P4004 now PPD1 from SVD at [redacted]w[redacted]d with post-partum bilateral salpingectomy performed on PPD0.   Routine post-partum care: doing well, bottle-feeding  Contraception: s/p PP BS Yeast infection - will treat w/ fluconazole  x 1 dose  Fetus: female - desires circumcision. Discussed r/b/a of the procedure.  Reviewed that circumcision is an elective surgical procedure and not considered medically necessary.  Reviewed the risks of the procedure including the risk of infection, bleeding, damage to surrounding structures, including scrotum, shaft, urethra and head of penis, and an undesired cosmetic effect requiring additional procedures for revision.  Dispo: Discharge home today after fetus circumcision.    LOS: 1 day   Charmaine CHRISTELLA Oz, MD 10/31/2024, 7:13 AM

## 2024-10-31 NOTE — Anesthesia Postprocedure Evaluation (Signed)
 Anesthesia Post Note  Patient: Diana Clark  Procedure(s) Performed: LIGATION, FALLOPIAN TUBE, POSTPARTUM     Patient location during evaluation: PACU Anesthesia Type: General Level of consciousness: awake and alert Pain management: pain level controlled Vital Signs Assessment: post-procedure vital signs reviewed and stable Respiratory status: spontaneous breathing, nonlabored ventilation, respiratory function stable and patient connected to nasal cannula oxygen Cardiovascular status: blood pressure returned to baseline and stable Postop Assessment: no apparent nausea or vomiting Anesthetic complications: no   No notable events documented.  Last Vitals:  Vitals:   10/31/24 0125 10/31/24 0540  BP: 111/70 98/71  Pulse: 84 73  Resp: 17 17  Temp:  (!) 36.3 C  SpO2: 97% 99%    Last Pain:  Vitals:   10/31/24 0757  TempSrc:   PainSc: Asleep   Pain Goal:                   Jani Moronta

## 2024-10-31 NOTE — Discharge Instructions (Signed)

## 2024-10-31 NOTE — Plan of Care (Signed)

## 2024-10-31 NOTE — Plan of Care (Signed)
 Problem: Education: Goal: Knowledge of General Education information will improve Description: Including pain rating scale, medication(s)/side effects and non-pharmacologic comfort measures 10/31/2024 0856 by Madison Rosina LABOR, LPN Outcome: Adequate for Discharge 10/31/2024 0757 by Madison Rosina LABOR, LPN Outcome: Progressing   Problem: Health Behavior/Discharge Planning: Goal: Ability to manage health-related needs will improve 10/31/2024 0856 by Madison Rosina LABOR, LPN Outcome: Adequate for Discharge 10/31/2024 0757 by Madison Rosina LABOR, LPN Outcome: Progressing   Problem: Clinical Measurements: Goal: Ability to maintain clinical measurements within normal limits will improve 10/31/2024 0856 by Madison Rosina LABOR, LPN Outcome: Adequate for Discharge 10/31/2024 0757 by Madison Rosina LABOR, LPN Outcome: Progressing Goal: Will remain free from infection 10/31/2024 0856 by Madison Rosina LABOR, LPN Outcome: Adequate for Discharge 10/31/2024 0757 by Madison Rosina LABOR, LPN Outcome: Progressing Goal: Diagnostic test results will improve 10/31/2024 0856 by Madison Rosina LABOR, LPN Outcome: Adequate for Discharge 10/31/2024 0757 by Madison Rosina LABOR, LPN Outcome: Progressing Goal: Respiratory complications will improve 10/31/2024 0856 by Madison Rosina LABOR, LPN Outcome: Adequate for Discharge 10/31/2024 0757 by Madison Rosina LABOR, LPN Outcome: Progressing Goal: Cardiovascular complication will be avoided 10/31/2024 0856 by Madison Rosina LABOR, LPN Outcome: Adequate for Discharge 10/31/2024 0757 by Madison Rosina LABOR, LPN Outcome: Progressing   Problem: Activity: Goal: Risk for activity intolerance will decrease 10/31/2024 0856 by Madison Rosina LABOR, LPN Outcome: Adequate for Discharge 10/31/2024 0757 by Madison Rosina LABOR, LPN Outcome: Progressing   Problem: Nutrition: Goal: Adequate nutrition will be maintained 10/31/2024 0856 by Madison Rosina LABOR, LPN Outcome: Adequate for Discharge 10/31/2024 0757 by Madison Rosina LABOR, LPN Outcome:  Progressing   Problem: Coping: Goal: Level of anxiety will decrease 10/31/2024 0856 by Madison Rosina LABOR, LPN Outcome: Adequate for Discharge 10/31/2024 0757 by Madison Rosina LABOR, LPN Outcome: Progressing   Problem: Elimination: Goal: Will not experience complications related to bowel motility 10/31/2024 0856 by Madison Rosina LABOR, LPN Outcome: Adequate for Discharge 10/31/2024 0757 by Madison Rosina LABOR, LPN Outcome: Progressing Goal: Will not experience complications related to urinary retention 10/31/2024 0856 by Madison Rosina LABOR, LPN Outcome: Adequate for Discharge 10/31/2024 0757 by Madison Rosina LABOR, LPN Outcome: Progressing   Problem: Pain Managment: Goal: General experience of comfort will improve and/or be controlled 10/31/2024 0856 by Madison Rosina LABOR, LPN Outcome: Adequate for Discharge 10/31/2024 0757 by Madison Rosina LABOR, LPN Outcome: Progressing   Problem: Safety: Goal: Ability to remain free from injury will improve 10/31/2024 0856 by Madison Rosina LABOR, LPN Outcome: Adequate for Discharge 10/31/2024 0757 by Madison Rosina LABOR, LPN Outcome: Progressing   Problem: Skin Integrity: Goal: Risk for impaired skin integrity will decrease 10/31/2024 0856 by Madison Rosina LABOR, LPN Outcome: Adequate for Discharge 10/31/2024 0757 by Madison Rosina LABOR, LPN Outcome: Progressing   Problem: Education: Goal: Knowledge of Childbirth will improve 10/31/2024 0856 by Madison Rosina LABOR, LPN Outcome: Adequate for Discharge 10/31/2024 0757 by Madison Rosina LABOR, LPN Outcome: Progressing Goal: Ability to make informed decisions regarding treatment and plan of care will improve 10/31/2024 0856 by Madison Rosina LABOR, LPN Outcome: Adequate for Discharge 10/31/2024 0757 by Madison Rosina LABOR, LPN Outcome: Progressing Goal: Ability to state and carry out methods to decrease the pain will improve 10/31/2024 0856 by Madison Rosina LABOR, LPN Outcome: Adequate for Discharge 10/31/2024 0757 by Madison Rosina LABOR, LPN Outcome:  Progressing Goal: Individualized Educational Video(s) 10/31/2024 0856 by Madison Rosina LABOR, LPN Outcome: Adequate for Discharge 10/31/2024 0757 by Madison Rosina LABOR, LPN Outcome: Progressing   Problem: Coping: Goal: Ability to verbalize  concerns and feelings about labor and delivery will improve 10/31/2024 0856 by Madison Rosina LABOR, LPN Outcome: Adequate for Discharge 10/31/2024 0757 by Madison Rosina LABOR, LPN Outcome: Progressing   Problem: Life Cycle: Goal: Ability to make normal progression through stages of labor will improve 10/31/2024 0856 by Madison Rosina LABOR, LPN Outcome: Adequate for Discharge 10/31/2024 0757 by Madison Rosina LABOR, LPN Outcome: Progressing Goal: Ability to effectively push during vaginal delivery will improve 10/31/2024 0856 by Madison Rosina LABOR, LPN Outcome: Adequate for Discharge 10/31/2024 0757 by Madison Rosina LABOR, LPN Outcome: Progressing   Problem: Role Relationship: Goal: Will demonstrate positive interactions with the child 10/31/2024 0856 by Madison Rosina LABOR, LPN Outcome: Adequate for Discharge 10/31/2024 0757 by Madison Rosina LABOR, LPN Outcome: Progressing   Problem: Safety: Goal: Risk of complications during labor and delivery will decrease 10/31/2024 0856 by Madison Rosina LABOR, LPN Outcome: Adequate for Discharge 10/31/2024 0757 by Madison Rosina LABOR, LPN Outcome: Progressing   Problem: Pain Management: Goal: Relief or control of pain from uterine contractions will improve 10/31/2024 0856 by Madison Rosina LABOR, LPN Outcome: Adequate for Discharge 10/31/2024 0757 by Madison Rosina LABOR, LPN Outcome: Progressing   Problem: Education: Goal: Knowledge of condition will improve 10/31/2024 0856 by Madison Rosina LABOR, LPN Outcome: Adequate for Discharge 10/31/2024 0757 by Madison Rosina LABOR, LPN Outcome: Progressing Goal: Individualized Educational Video(s) 10/31/2024 0856 by Madison Rosina LABOR, LPN Outcome: Adequate for Discharge 10/31/2024 0757 by Madison Rosina LABOR, LPN Outcome:  Progressing Goal: Individualized Newborn Educational Video(s) 10/31/2024 0856 by Madison Rosina LABOR, LPN Outcome: Adequate for Discharge 10/31/2024 0757 by Madison Rosina LABOR, LPN Outcome: Progressing   Problem: Activity: Goal: Will verbalize the importance of balancing activity with adequate rest periods 10/31/2024 0856 by Madison Rosina LABOR, LPN Outcome: Adequate for Discharge 10/31/2024 0757 by Madison Rosina LABOR, LPN Outcome: Progressing Goal: Ability to tolerate increased activity will improve 10/31/2024 0856 by Madison Rosina LABOR, LPN Outcome: Adequate for Discharge 10/31/2024 0757 by Madison Rosina LABOR, LPN Outcome: Progressing   Problem: Coping: Goal: Ability to identify and utilize available resources and services will improve 10/31/2024 0856 by Madison Rosina LABOR, LPN Outcome: Adequate for Discharge 10/31/2024 0757 by Madison Rosina LABOR, LPN Outcome: Progressing   Problem: Life Cycle: Goal: Chance of risk for complications during the postpartum period will decrease 10/31/2024 0856 by Madison Rosina LABOR, LPN Outcome: Adequate for Discharge 10/31/2024 0757 by Madison Rosina LABOR, LPN Outcome: Progressing   Problem: Role Relationship: Goal: Ability to demonstrate positive interaction with newborn will improve 10/31/2024 0856 by Madison Rosina LABOR, LPN Outcome: Adequate for Discharge 10/31/2024 0757 by Madison Rosina LABOR, LPN Outcome: Progressing   Problem: Skin Integrity: Goal: Demonstration of wound healing without infection will improve 10/31/2024 0856 by Madison Rosina LABOR, LPN Outcome: Adequate for Discharge 10/31/2024 0757 by Madison Rosina LABOR, LPN Outcome: Progressing

## 2024-11-01 ENCOUNTER — Inpatient Hospital Stay (HOSPITAL_COMMUNITY)

## 2024-11-01 ENCOUNTER — Inpatient Hospital Stay (HOSPITAL_COMMUNITY): Admission: AD | Admit: 2024-11-01 | Source: Home / Self Care | Admitting: Obstetrics and Gynecology

## 2024-11-01 LAB — SURGICAL PATHOLOGY

## 2024-11-09 ENCOUNTER — Telehealth (HOSPITAL_COMMUNITY): Payer: Self-pay | Admitting: *Deleted

## 2024-11-09 NOTE — Telephone Encounter (Signed)
 11/09/2024  Name: Diana Clark MRN: [id number] DOB: 1991/02/15  Reason for Call:  [facility] of Care [facility] Discharge Call  Contact Status: Patient Contact Status: Message  Language assistant needed:          Follow-Up Questions:    Van Postnatal Depression Scale:  In the Past 7 Days:    PHQ2-9 Depression Scale:     Discharge Follow-up:    Post-discharge interventions: NA  Mliss Sieve, RN 11/09/2024 11:48
